# Patient Record
Sex: Female | Born: 1995
Health system: Southern US, Community
[De-identification: ages and names within clinical notes are randomized; demographics above are authoritative.]

## PROBLEM LIST (undated history)

## (undated) HISTORY — PX: APPENDECTOMY: SHX54

---
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
## 2013-07-25 ENCOUNTER — Emergency Department: Payer: Self-pay | Admitting: Emergency Medicine

## 2013-07-25 LAB — URINALYSIS, COMPLETE
BACTERIA: NONE SEEN
BILIRUBIN, UR: NEGATIVE
BLOOD: NEGATIVE
Glucose,UR: NEGATIVE mg/dL (ref 0–75)
KETONE: NEGATIVE
Nitrite: NEGATIVE
Ph: 8 (ref 4.5–8.0)
Protein: NEGATIVE
RBC,UR: NONE SEEN /HPF (ref 0–5)
Specific Gravity: 1.015 (ref 1.003–1.030)
WBC UR: 3 /HPF (ref 0–5)

## 2013-07-25 LAB — COMPREHENSIVE METABOLIC PANEL
AST: 11 U/L (ref 0–26)
Albumin: 3.2 g/dL — ABNORMAL LOW (ref 3.8–5.6)
Alkaline Phosphatase: 53 U/L
Anion Gap: 9 (ref 7–16)
BUN: 6 mg/dL — ABNORMAL LOW (ref 7–18)
Bilirubin,Total: 0.3 mg/dL (ref 0.2–1.0)
CO2: 23 mmol/L (ref 21–32)
CREATININE: 0.45 mg/dL — AB (ref 0.60–1.30)
Calcium, Total: 8.6 mg/dL — ABNORMAL LOW (ref 9.0–10.7)
Chloride: 104 mmol/L (ref 98–107)
EGFR (African American): >60
EGFR (Non-African Amer.): >60
GLUCOSE: 84 mg/dL (ref 65–99)
OSMOLALITY: 269 (ref 275–301)
POTASSIUM: 3.6 mmol/L (ref 3.5–5.1)
SGPT (ALT): 17 U/L (ref 12–78)
SODIUM: 136 mmol/L (ref 136–145)
Total Protein: 7.4 g/dL (ref 6.4–8.6)

## 2013-07-25 LAB — CBC WITH DIFFERENTIAL/PLATELET
Basophil #: 0 10*3/uL (ref 0.0–0.1)
Basophil %: 0.3 %
EOS ABS: 0 10*3/uL (ref 0.0–0.7)
Eosinophil %: 0.4 %
HCT: 39.5 % (ref 35.0–47.0)
HGB: 13.7 g/dL (ref 12.0–16.0)
Lymphocyte #: 1.4 10*3/uL (ref 1.0–3.6)
Lymphocyte %: 13.1 %
MCH: 30.3 pg (ref 26.0–34.0)
MCHC: 34.6 g/dL (ref 32.0–36.0)
MCV: 88 fL (ref 80–100)
MONOS PCT: 4 %
Monocyte #: 0.4 x10 3/mm (ref 0.2–0.9)
NEUTROS PCT: 82.2 %
Neutrophil #: 8.5 10*3/uL — ABNORMAL HIGH (ref 1.4–6.5)
Platelet: 211 10*3/uL (ref 150–440)
RBC: 4.52 10*6/uL (ref 3.80–5.20)
RDW: 13.2 % (ref 11.5–14.5)
WBC: 10.3 10*3/uL (ref 3.6–11.0)

## 2013-10-09 ENCOUNTER — Encounter: Payer: Self-pay | Admitting: Maternal & Fetal Medicine

## 2013-12-11 ENCOUNTER — Encounter: Payer: Self-pay | Admitting: Obstetrics and Gynecology

## 2014-01-28 ENCOUNTER — Inpatient Hospital Stay: Payer: Self-pay | Admitting: Certified Nurse Midwife

## 2014-01-28 LAB — CBC WITH DIFFERENTIAL/PLATELET
BASOS ABS: 0 10*3/uL (ref 0.0–0.1)
BASOS PCT: 0.3 %
EOS ABS: 0 10*3/uL (ref 0.0–0.7)
Eosinophil %: 0.1 %
HCT: 36.7 % (ref 35.0–47.0)
HGB: 12.5 g/dL (ref 12.0–16.0)
LYMPHS PCT: 10.6 %
Lymphocyte #: 1.2 10*3/uL (ref 1.0–3.6)
MCH: 29.4 pg (ref 26.0–34.0)
MCHC: 34 g/dL (ref 32.0–36.0)
MCV: 87 fL (ref 80–100)
MONOS PCT: 5.8 %
Monocyte #: 0.7 x10 3/mm (ref 0.2–0.9)
NEUTROS PCT: 83.2 %
Neutrophil #: 9.6 10*3/uL — ABNORMAL HIGH (ref 1.4–6.5)
Platelet: 197 10*3/uL (ref 150–440)
RBC: 4.24 10*6/uL (ref 3.80–5.20)
RDW: 14.8 % — ABNORMAL HIGH (ref 11.5–14.5)
WBC: 11.5 10*3/uL — AB (ref 3.6–11.0)

## 2014-01-30 LAB — CBC WITH DIFFERENTIAL/PLATELET
Basophil #: 0 10*3/uL (ref 0.0–0.1)
Basophil %: 0.4 %
EOS ABS: 0 10*3/uL (ref 0.0–0.7)
EOS PCT: 0.4 %
HCT: 26.6 % — AB (ref 35.0–47.0)
HGB: 8.9 g/dL — ABNORMAL LOW (ref 12.0–16.0)
LYMPHS PCT: 27.4 %
Lymphocyte #: 2.5 10*3/uL (ref 1.0–3.6)
MCH: 29.9 pg (ref 26.0–34.0)
MCHC: 33.4 g/dL (ref 32.0–36.0)
MCV: 90 fL (ref 80–100)
MONOS PCT: 4.6 %
Monocyte #: 0.4 x10 3/mm (ref 0.2–0.9)
Neutrophil #: 6 10*3/uL (ref 1.4–6.5)
Neutrophil %: 67.2 %
PLATELETS: 139 10*3/uL — AB (ref 150–440)
RBC: 2.97 10*6/uL — ABNORMAL LOW (ref 3.80–5.20)
RDW: 15.5 % — ABNORMAL HIGH (ref 11.5–14.5)
WBC: 9 10*3/uL (ref 3.6–11.0)

## 2014-02-04 LAB — GC/CHLAMYDIA PROBE AMP

## 2016-12-11 ENCOUNTER — Ambulatory Visit (INDEPENDENT_AMBULATORY_CARE_PROVIDER_SITE_OTHER): Payer: 59 | Admitting: Family Medicine

## 2016-12-11 ENCOUNTER — Encounter: Payer: Self-pay | Admitting: Family Medicine

## 2016-12-11 VITALS — BP 120/70 | HR 72 | Ht 64.0 in | Wt 166.0 lb

## 2016-12-11 DIAGNOSIS — Z7689 Persons encountering health services in other specified circumstances: Secondary | ICD-10-CM

## 2016-12-11 NOTE — Progress Notes (Signed)
Name: Bethany Gonzalez   MRN: [id number]    DOB: 06-29-1995   Date:12/11/2016       Progress Note  Subjective  Chief Complaint  Chief Complaint  Patient presents with  . Establish Care    needs PCP    Patient needs to establish care with new physician.    No problem-specific Assessment & Plan notes found for this encounter.   History reviewed. No pertinent past medical history.  Past Surgical History:  Procedure Laterality Date  . APPENDECTOMY      Family History  Problem Relation Age of Onset  . Hypertension Mother   . Hypertension Father   . Diabetes Maternal Grandmother   . Diabetes Paternal Grandmother   . Stroke Paternal Grandmother     Social History   Social History  . Marital status: Single    Spouse name: N/A  . Number of children: N/A  . Years of education: N/A   Occupational History  . Not on file.   Social History Main Topics  . Smoking status: Never Smoker  . Smokeless tobacco: Never Used  . Alcohol use Yes  . Drug use: No  . Sexual activity: No   Other Topics Concern  . Not on file   Social History Narrative  . No narrative on file    No Known Allergies  No outpatient prescriptions prior to visit.   No facility-administered medications prior to visit.     Review of Systems  Constitutional: Negative for chills, fever, malaise/fatigue and weight loss.  HENT: Negative for ear discharge, ear pain and sore throat.   Eyes: Negative for blurred vision.  Respiratory: Negative for cough, sputum production, shortness of breath and wheezing.   Cardiovascular: Negative for chest pain, palpitations and leg swelling.  Gastrointestinal: Negative for abdominal pain, blood in stool, constipation, diarrhea, heartburn, melena and nausea.  Genitourinary: Negative for dysuria, frequency, hematuria and urgency.  Musculoskeletal: Negative for back pain, joint pain, myalgias and neck pain.  Skin: Negative for rash.  Neurological: Positive for headaches.  Negative for dizziness, tingling, sensory change and focal weakness.  Endo/Heme/Allergies: Negative for environmental allergies and polydipsia. Does not bruise/bleed easily.  Psychiatric/Behavioral: Negative for depression and suicidal ideas. The patient is not nervous/anxious and does not have insomnia.      Objective  Vitals:   12/11/16 1505  BP: 120/70  Pulse: 72  Weight: 166 lb (75.3 kg)  Height:  (1.626 m)    Physical Exam  Constitutional: She is oriented to person, place, and time and well-developed, well-nourished, and in no distress.  Cardiovascular: Normal rate and normal heart sounds.   Pulmonary/Chest: Effort normal and breath sounds normal.  Musculoskeletal: Normal range of motion.  Neurological: She is alert and oriented to person, place, and time.  Skin: Skin is warm.  Psychiatric: Affect normal.  Nursing note and vitals reviewed.     Assessment & Plan  Problem List Items Addressed This Visit    None    Visit Diagnoses    Encounter to establish care    -  Primary    Patient is new to the area. Getting established for future issues or concerns  No orders of the defined types were placed in this encounter.     Dr. Hayden [facility]  12/11/16

## 2020-03-16 NOTE — L&D Delivery Note (Signed)
Delivery Note Primary OB: [facility] Delivery Physician: Annamarie Major, MD Gestational Age: Full term Antepartum complications: none Intrapartum complications: None  A viable Female was delivered via vertex presentation.  Apgars:8 ,9  Weight:  pending .   Placenta status: spontaneous and Intact.  Cord: 3+ vessels;  with the following complications: none.  Anesthesia:  none Episiotomy:  none Lacerations:  1st Suture Repair: 2.0 vicryl Est. Blood Loss (mL):  200 mL  Mom to postpartum.  Baby to Couplet care / Skin to Skin.  Annamarie Major, MD, [facility], [facility] 12/07/2020  2:20 AM [phone]

## 2020-05-01 ENCOUNTER — Other Ambulatory Visit (HOSPITAL_COMMUNITY): Payer: Self-pay | Admitting: Advanced Practice Midwife

## 2020-05-01 ENCOUNTER — Ambulatory Visit (INDEPENDENT_AMBULATORY_CARE_PROVIDER_SITE_OTHER): Payer: 59 | Admitting: Advanced Practice Midwife

## 2020-05-01 ENCOUNTER — Other Ambulatory Visit: Payer: Self-pay

## 2020-05-01 ENCOUNTER — Other Ambulatory Visit (HOSPITAL_COMMUNITY)
Admission: RE | Admit: 2020-05-01 | Discharge: 2020-05-01 | Disposition: A | Discharge status: Home / Self Care | Payer: 59 | Source: Ambulatory Visit | Attending: Advanced Practice Midwife | Admitting: Advanced Practice Midwife

## 2020-05-01 ENCOUNTER — Encounter: Payer: Self-pay | Admitting: Advanced Practice Midwife

## 2020-05-01 VITALS — BP 135/77 | Wt 183.0 lb

## 2020-05-01 DIAGNOSIS — Z139 Encounter for screening, unspecified: Secondary | ICD-10-CM

## 2020-05-01 DIAGNOSIS — Z369 Encounter for antenatal screening, unspecified: Secondary | ICD-10-CM | POA: Diagnosis not present

## 2020-05-01 DIAGNOSIS — N926 Irregular menstruation, unspecified: Secondary | ICD-10-CM | POA: Diagnosis not present

## 2020-05-01 DIAGNOSIS — O219 Vomiting of pregnancy, unspecified: Secondary | ICD-10-CM | POA: Diagnosis not present

## 2020-05-01 DIAGNOSIS — Z113 Encounter for screening for infections with a predominantly sexual mode of transmission: Secondary | ICD-10-CM | POA: Insufficient documentation

## 2020-05-01 DIAGNOSIS — Z124 Encounter for screening for malignant neoplasm of cervix: Secondary | ICD-10-CM | POA: Diagnosis not present

## 2020-05-01 DIAGNOSIS — Z3481 Encounter for supervision of other normal pregnancy, first trimester: Secondary | ICD-10-CM | POA: Diagnosis not present

## 2020-05-01 DIAGNOSIS — O99211 Obesity complicating pregnancy, first trimester: Secondary | ICD-10-CM | POA: Diagnosis not present

## 2020-05-01 DIAGNOSIS — Z3490 Encounter for supervision of normal pregnancy, unspecified, unspecified trimester: Secondary | ICD-10-CM | POA: Insufficient documentation

## 2020-05-01 LAB — POCT URINE PREGNANCY: Preg Test, Ur: POSITIVE — AB

## 2020-05-01 MED ORDER — ONDANSETRON 4 MG PO TBDP: 4.0000 mg | ORAL_TABLET | Freq: Four times a day (QID) | ORAL | 2 refills | Status: DC | PRN

## 2020-05-01 NOTE — Progress Notes (Signed)
New Obstetric Patient H&P    Chief Complaint: "Desires prenatal care"   History of Present Illness: Patient is a 25 y.o. G2P1001 Hispanic or Latino female, presents with amenorrhea and positive home pregnancy test. Patient's last menstrual period was 02/24/2020 (exact date). and based on her  LMP, her EDD is Estimated Date of Delivery: 11/30/20 and her EGA is [redacted]w[redacted]d. Cycles are 7 days, irregular, and occur approximately every : 2-3 months. Her last pap smear was 6 or 7 years ago and was no abnormalities.    She had a urine pregnancy test which was positive 1 week(s)  ago. Her last menstrual period was normal and lasted for  7 day(s). Since her LMP she claims she has experienced nausea and vomiting. She denies vaginal bleeding. Her past medical history is noncontributory. Her prior pregnancies are notable for 2016 FT SVD female 7#10oz  Since her LMP, she admits to the use of tobacco products  no She claims she has gained no pounds since the start of her pregnancy.  There are cats in the home in the home  no  She admits close contact with children on a regular basis  yes  She has had chicken pox in the past no She has had Tuberculosis exposures, symptoms, or previously tested positive for TB   no Current or past history of domestic violence. no  Genetic Screening/Teratology Counseling: (Includes patient, baby's father, or anyone in either family with:)   1. Patient's age >/= 37 at [facility]  no 2. Thalassemia ([place], [place], Mediterranean, or Asian background): MCV<80  no 3. Neural tube defect (meningomyelocele, spina bifida, anencephaly)  no 4. Congenital heart defect  no  5. Down syndrome  no 6. Tay-Sachs (Jewish, [place]  no 7. Canavan's Disease  no 8. Sickle cell disease or trait (African)  no  9. Hemophilia or other blood disorders  no  10. Muscular dystrophy  no  11. Cystic fibrosis  no  12. Huntington's Chorea  no  13. Mental retardation/autism  no 14. Other inherited genetic  or chromosomal disorder  no 15. Maternal metabolic disorder (DM, PKU, etc)  no 16. Patient or FOB with a child with a birth defect not listed above no  16a. Patient or FOB with a birth defect themselves no 17. Recurrent pregnancy loss, or stillbirth  no  18. Any medications since LMP other than prenatal vitamins (include vitamins, supplements, OTC meds, drugs, alcohol)  no 19. Any other genetic/environmental exposure to discuss  no  Infection History:   1. Lives with someone with TB or TB exposed  no  2. Patient or partner has history of genital herpes  no 3. Rash or viral illness since LMP  no 4. History of STI (GC, CT, HPV, syphilis, HIV)  no 5. History of recent travel :  no  Other pertinent information:  no    Review of Systems:10 point review of systems negative unless otherwise noted in HPI  Past Medical History:  Patient Active Problem List   Diagnosis Date Noted  . Supervision of normal pregnancy 05/01/2020    Clinic [facility] Prenatal Labs  Dating  Blood type:     Genetic Screen 1 Screen:    AFP:     Quad:     NIPS: Antibody:   Anatomic [place]  Rubella:    Varicella: @VZVIGG @  GTT Early:                Third trimester:  RPR:     Rhogam  HBsAg:     Vaccines TDAP:                       Flu Shot: Covid: HIV:     Baby Food Breast                               GBS:   GC/CT:  Contraception  Pap: 2015 neg, 05/01/20  CBB     CS/VBAC NA   Support Person Francisco         Past Surgical History:  Past Surgical History:  Procedure Laterality Date  . APPENDECTOMY      Gynecologic History: Patient's last menstrual period was 02/24/2020 (exact date).  Obstetric History: G2P1001  Family History:  Family History  Problem Relation Age of Onset  . Hypertension Mother   . Hypertension Father   . Diabetes Maternal Grandmother   . Diabetes Paternal Grandmother   . Stroke Paternal Grandmother     Social History:  Social History   Socioeconomic History  . Marital  status: Single    Spouse name: Not on file  . Number of children: Not on file  . Years of education: Not on file  . Highest education level: Not on file  Occupational History  . Not on file  Tobacco Use  . Smoking status: Never Smoker  . Smokeless tobacco: Never Used  Vaping Use  . Vaping Use: Never used  Substance and Sexual Activity  . Alcohol use: Yes  . Drug use: No  . Sexual activity: Yes  Other Topics Concern  . Not on file  Social History Narrative  . Not on file   Social Determinants of Health   Financial Resource Strain: Not on file  Food Insecurity: Not on file  Transportation Needs: Not on file  Physical Activity: Not on file  Stress: Not on file  Social Connections: Not on file  Intimate Partner Violence: Not on file    Allergies:  No Known Allergies  Medications: Prior to Admission medications   Medication Sig Start Date End Date Taking? Authorizing Provider  ondansetron (ZOFRAN ODT) 4 MG disintegrating tablet Take 1 tablet (4 mg total) by mouth every 6 (six) hours as needed for nausea. 05/01/20   Tresea Mall, CNM  Prenatal 28-0.8 MG TABS Take 1 tablet by mouth daily.    [provider]    Physical Exam Vitals: Blood pressure 135/77, weight 183 lb (83 kg), last menstrual period 02/24/2020.  General: NAD HEENT: normocephalic, anicteric Thyroid: no enlargement, no palpable nodules Pulmonary: No increased work of breathing, CTAB Cardiovascular: RRR, distal pulses 2+ Abdomen: NABS, soft, non-tender, non-distended.  Umbilicus without lesions.  No hepatomegaly, splenomegaly or masses palpable. No evidence of hernia  Genitourinary:  External: Normal external female genitalia.  Normal urethral meatus, normal Bartholin's and Skene's glands.    Vagina: Normal vaginal mucosa, no evidence of prolapse.    Cervix: Grossly normal in appearance, friable on exam  Uterus:  Non-enlarged, mobile, normal contour.  No CMT  Adnexa: deferred  Rectal:  deferred Extremities: no edema, erythema, or tenderness Neurologic: Grossly intact Psychiatric: mood appropriate, affect full   The following were addressed during this visit:  Breastfeeding Education - Early initiation of breastfeeding    Comments: Keeps milk supply adequate, helps contract uterus and slow bleeding, and early milk is the perfect first food and is easy to digest.   - The importance of exclusive  breastfeeding    Comments: Provides antibodies, Lower risk of breast and ovarian cancers, and type-2 diabetes,Helps your body recover, Reduced chance of SIDS.   - Risks of giving your baby anything other than breast milk if you are breastfeeding    Comments: Make the baby less content with breastfeeds, may make my baby more susceptible to illness, and may reduce my milk supply.   - The importance of early skin-to-skin contact    Comments: Keeps baby warm and secure, helps keep baby's blood sugar up and breathing steady, easier to bond and breastfeed, and helps calm baby.  - Rooming-in on a 24-hour basis    Comments: Easier to learn baby's feeding cues, easier to bond and get to know each other, and encourages milk production.   - Feeding on demand or baby-led feeding    Comments: Helps prevent breastfeeding complications, helps bring in good milk supply, prevents under or overfeeding, and helps baby feel content and satisfied   - Frequent feeding to help assure optimal milk production    Comments: Making a full supply of milk requires frequent removal of milk from breasts, infant will eat 8-12 times in 24 hours, if separated from infant use breast massage, hand expression and/ or pumping to remove milk from breasts.   - Effective positioning and attachment    Comments: Helps my baby to get enough breast milk, helps to produce an adequate milk supply, and helps prevent nipple pain and damage   - Exclusive breastfeeding for the first 6 months    Comments: Builds a  healthy milk supply and keeps it up, protects baby from sickness and disease, and breastmilk has everything your baby needs for the first 6 months.  - Individualized Education    Comments: Contraindications to breastfeeding and other special medical conditions Patient has experience breastfeeding her first baby    Assessment: 14 y.o. G2P1001 at [redacted]w[redacted]d presenting to initiate prenatal care  Plan: 1) Avoid alcoholic beverages. 2) Patient encouraged not to smoke.  3) Discontinue the use of all non-medicinal drugs and chemicals.  4) Take prenatal vitamins daily.  5) Nutrition, food safety (fish, cheese advisories, and high nitrite foods) and exercise discussed. 6) Hospital and practice style discussed with cross coverage system.  7) Genetic Screening, such as with 1st Trimester Screening, cell free fetal DNA, AFP testing, and Ultrasound, as well as with amniocentesis and CVS as appropriate, is discussed with patient. At the conclusion of today's visit patient requested cell free DNA genetic testing 8) Patient is asked about travel to areas at risk for the Zika virus, and counseled to avoid travel and exposure to mosquitoes or sexual partners who may have themselves been exposed to the virus. Testing is discussed, and will be ordered as appropriate.  9) PAPtima, urine culture today 10) Return to clinic in 1 week for dating scan, early 1 hr gtt, NOB panel, sickle cell screen, MaterniT 21 if 10+ weeks   Tresea Mall, CNM [facility] 05/01/2020, 3:22 PM

## 2020-05-01 NOTE — Patient Instructions (Signed)
Exercise During Pregnancy Exercise is an important part of being healthy for people of all ages. Exercise improves the function of your heart and lungs and helps you maintain strength, flexibility, and a healthy body weight. Exercise also boosts energy levels and elevates mood. Most women should exercise regularly during pregnancy. Exercise routines may need to change as your pregnancy progresses. In rare cases, women with certain medical conditions or complications may be asked to limit or avoid exercise during pregnancy. Your health care provider will give you information on what will work for you. How does this affect me? Along with maintaining general strength and flexibility, exercising during pregnancy can help:  Keep strength in muscles that are used during labor and childbirth.  Decrease low back pain or symptoms of depression.  Control weight gain during pregnancy.  Reduce the risk of needing insulin if you develop diabetes during pregnancy.  Decrease the risk of cesarean delivery.  Speed up your recovery after giving birth.  Relieve constipation. How does this affect my baby? Exercise can help you have a healthy pregnancy. Exercise does not cause early (premature) birth. It will not cause your baby to weigh less at birth. What exercises can I do? Many exercises are safe for you to do during pregnancy. Do a variety of exercises that safely increase your heart and breathing rates and help you build and maintain muscle strength. Do exercises exactly as told by your health care provider. You may do these exercises:  Walking.  Swimming.  Water aerobics.  Riding a stationary bike.  Modified yoga or Pilates. Tell your instructor that you are pregnant. Avoid overstretching, and avoid lying on your back for long periods of time.  Running or jogging. Choose this type of exercise only if: ? You ran or jogged regularly before your pregnancy. ? You can run or jog and still talk in  complete sentences.   What exercises should I avoid? You may be told to limit high-intensity exercise depending on your level of fitness and whether you exercised regularly before you were pregnant. You can tell that you are exercising at a high intensity if you are breathing much harder and faster and cannot hold a conversation while exercising. You must avoid:  Contact sports.  Activities that put you at risk for falling on or being hit in the belly, such as downhill skiing, waterskiing, surfing, rock climbing, cycling, gymnastics, and horseback riding.  Scuba diving.  Skydiving.  Hot yoga or hot Pilates. These activities take place in a room that is heated to high temperatures.  Jogging or running, unless you jogged or ran regularly before you were pregnant. While jogging or running, you should always be able to talk in full sentences. Do not run or jog so fast that you are unable to have a conversation.  Do not exercise at more than 6,000 feet above sea level (high elevation) if you are not used to exercising at high elevation. How do I exercise in a safe way?  Avoid overheating. Do not exercise in very high temperatures.  Wear loose-fitting, breathable clothes.  Avoid dehydration. Drink enough fluid before, during, and after exercise to keep your urine pale yellow.  Avoid overstretching. Because of hormone changes during pregnancy, it is easy to overstretch muscles, tendons, and ligaments.  Start slowly and ask your health care provider to recommend the types of exercise that are safe for you.  Do not exercise to lose weight.  Wear a sports bra to support your breasts.  Avoid standing still or lying flat on your back as much as you can.   Follow these instructions at home:  Exercise on most days or all days of the week. Try to exercise for 30 minutes a day, 5 days a week, unless your health care provider tells you not to.  If you actively exercised before your pregnancy and  you are healthy, your health care provider may tell you to continue to do moderate-intensity to high-intensity exercise.  If you are just starting to exercise or did not exercise much before your pregnancy, your health care provider may tell you to do low-intensity to moderate-intensity exercise. Questions to ask your health care provider  Is exercise safe for me?  What are signs that I should stop exercising?  Does my health condition mean that I should not exercise during pregnancy?  When should I avoid exercising during pregnancy? Stop exercising and contact a health care provider if: You have any unusual symptoms such as:  Mild contractions of the uterus or cramps in the abdomen.  A dizzy feeling that does not go away when you rest. Stop exercising and get help right away if: You have any unusual symptoms such as:  Sudden, severe pain in your low back or your belly.  Regular, painful contractions of your uterus.  Chest pain.  Bleeding or fluid leaking from your vagina.  Shortness of breath.  Headache.  Pain and swelling of your calves. Summary  Most women should exercise regularly throughout pregnancy. In rare cases, women with certain medical conditions or complications may be asked to limit or avoid exercise during pregnancy.  Do not exercise to lose weight during pregnancy.  Your health care provider will tell you what level of physical activity is right for you.  Stop exercising and contact a health care provider if you have unusual symptoms, such as mild contractions or dizziness. This information is not intended to replace advice given to you by your health care provider. Make sure you discuss any questions you have with your health care provider. Document Revised: 10/18/2019 Document Reviewed: 10/18/2019 Elsevier Patient Education  2021 [place] for Pregnant Women While you are pregnant, your body requires additional nutrition to help  support your growing baby. You also have a higher need for some vitamins and minerals, such as folic acid, calcium, iron, and vitamin D. Eating a healthy, well-balanced diet is very important for your health and your baby's health. Your need for extra calories varies over the course of your pregnancy. Pregnancy is divided into three trimesters, with each trimester lasting 3 months. For most women, it is recommended to consume:  150 extra calories a day during the first trimester.  300 extra calories a day during the second trimester.  300 extra calories a day during the third trimester. What are tips for following this plan? Cooking  Practice good food safety and cleanliness. Wash your hands before you eat and after you prepare raw meat. Wash all fruits and vegetables well before peeling or eating. Taking these actions can help to prevent foodborne illnesses that can be very dangerous to your baby, such as listeriosis. Ask your health care provider for more information about listeriosis.  Make sure that all meats, poultry, and eggs are cooked to food-safe temperatures or "well-done." Meal planning  Eat a variety of foods (especially fruits and vegetables) to get a full range of vitamins and minerals.  Two or more servings of fish are recommended each week in  order to get the most benefits from omega-3 fatty acids that are found in seafood. Choose fish that are lower in mercury, such as salmon and pollock.  Limit your overall intake of foods that have "empty calories." These are foods that have little nutritional value, such as sweets, desserts, candies, and sugar-sweetened beverages.  Drinks that contain caffeine are okay to drink, but it is better to avoid caffeine. Keep your total caffeine intake to less than 200 mg each day (which is 12 oz or 355 mL of coffee, tea, or soda) or the limit as told by your health care provider.   General information  Do not try to lose weight or go on a diet  during pregnancy.  Take a prenatal vitamin to help meet your additional vitamin and mineral needs during pregnancy, specifically for folic acid, iron, calcium, and vitamin D.  Remember to stay active. Ask your health care provider what types of exercise and activities are safe for you. What does 150 extra calories look like? Healthy options that provide 150 extra calories each day could be any of the following:  6-8 oz (170-227 g) plain low-fat yogurt with  cup (70 g) berries.  1 apple with 2 tsp (11 g) peanut butter.  Cut-up vegetables with  cup (60 g) hummus.  8 fl oz (237 mL) low-fat chocolate milk.  1 stick of string cheese with 1 medium orange.  1 peanut butter and jelly sandwich that is made with one slice of whole-wheat bread and 1 tsp (5 g) of peanut butter. For 300 extra calories, you could eat two of these healthy options each day. What is a healthy amount of weight to gain? The right amount of weight gain for you is based on your BMI (body mass index) before you became pregnant.  If your BMI was less than 18 (underweight), you should gain 28-40 lb (13-18 kg).  If your BMI was 18-24.9 (normal), you should gain 25-35 lb (11-16 kg).  If your BMI was 25-29.9 (overweight), you should gain 15-25 lb (7-11 kg).  If your BMI was 30 or greater (obese), you should gain 11-20 lb (5-9 kg). What if I am having twins or multiples? Generally, if you are carrying twins or multiples:  You may need to eat 300-600 extra calories a day.  The recommended range for total weight gain is 25-54 lb (11-25 kg), depending on your BMI before pregnancy.  Talk with your health care provider to find out about nutritional needs, weight gain, and exercise that is right for you. What foods should I eat? Fruits All fruits. Eat a variety of colors and types of fruit. Remember to wash your fruits well before peeling or eating. Vegetables All vegetables. Eat a variety of colors and types of  vegetables. Remember to wash your vegetables well before peeling or eating. Grains All grains. Choose whole grains, such as whole-wheat bread, oatmeal, or brown rice. Meats and other protein foods Lean meats, including chicken, turkey, and lean cuts of beef, veal, or pork. Fish that is higher in omega-3 fatty acids and lower in mercury, such as salmon, herring, mussels, trout, sardines, pollock, shrimp, crab, and lobster. Tofu. Tempeh. Beans. Eggs. Peanut butter and other nut butters. Dairy Pasteurized milk and milk alternatives, such as almond milk. Pasteurized yogurt and pasteurized cheese. Cottage cheese. Sour cream. Beverages Water. Juices that contain 100% fruit juice or vegetable juice. Caffeine-free teas and decaffeinated coffee. Fats and oils Fats and oils are okay to include in moderation.   Sweets and desserts Sweets and desserts are okay to include in moderation. Seasoning and other foods All pasteurized condiments. The items listed above may not be a complete list of foods and beverages you can eat. Contact a dietitian for more information.   What foods should I avoid? Fruits Raw (unpasteurized) fruit juices. Vegetables Unpasteurized vegetable juices. Meats and other protein foods Precooked or cured meat, such as bologna, hot dogs, sausages, or meat loaves. (If you must eat those meats, reheat them until they are steaming hot.) Refrigerated pate, meat spreads from a meat counter, or smoked seafood that is found in the refrigerated section of a store. Raw or undercooked meats, poultry, and eggs. Raw fish, such as sushi or sashimi. Fish that have high mercury content, such as tilefish, shark, swordfish, and king mackerel. Dairy Unpasteurized milk and any foods that have unpasteurized milk in them. Soft cheeses, such as feta, queso blanco, queso fresco, [place], [place], panela, and blue-veined cheeses (unless they are made with pasteurized milk, which must be stated on the  label). Beverages Alcohol. Sugar-sweetened beverages, such as sodas, teas, or energy drinks. Seasoning and other foods Homemade fermented foods and drinks, such as pickles, sauerkraut, or kombucha drinks. (Store-bought pasteurized versions of these are okay.) Salads that are made in a store or deli, such as ham salad, chicken salad, egg salad, tuna salad, and seafood salad. The items listed above may not be a complete list of foods and beverages you should avoid. Contact a dietitian for more information. Where to find more information To calculate the number of calories you need based on your height, weight, and activity level, you can use an online calculator such as:  PayStrike.dk To calculate how much weight you should gain during pregnancy, you can use an online pregnancy weight gain calculator such as:  http://www.harvey.com/ To learn more about eating fish during pregnancy, talk with your health care provider or visit:  PumpkinSearch.com.ee Summary  While you are pregnant, your body requires additional nutrition to help support your growing baby.  Eat a variety of foods, especially fruits and vegetables, to get a full range of vitamins and minerals.  Practice good food safety and cleanliness. Wash your hands before you eat and after you prepare raw meat. Wash all fruits and vegetables well before peeling or eating. Taking these actions can help to prevent foodborne illnesses, such as listeriosis, that can be very dangerous to your baby.  Do not eat raw meat or fish. Do not eat fish that have high mercury content, such as tilefish, shark, swordfish, and king mackerel. Do not eat raw (unpasteurized) dairy.  Take a prenatal vitamin to help meet your additional vitamin and mineral needs during pregnancy, specifically for folic acid, iron, calcium, and vitamin D. This information is not intended to replace advice given to you by your health care provider. Make sure you discuss any  questions you have with your health care provider. Document Revised: 09/28/2019 Document Reviewed: 09/28/2019 Elsevier Patient Education  2021 Elsevier Inc. Morning Sickness  Morning sickness is when a woman feels nauseous during pregnancy. This nauseous feeling may or may not come with vomiting. It often occurs in the morning, but it can be a problem at any time of day. Morning sickness is most common during the first trimester. In some cases, it may continue throughout pregnancy. Although morning sickness is unpleasant, it is usually harmless unless the woman develops severe and continual vomiting (hyperemesis gravidarum), a condition that requires more intense treatment. What are  the causes? The exact cause of this condition is not known, but it seems to be related to normal hormonal changes that occur in pregnancy. What increases the risk? You are more likely to develop this condition if:  You experienced nausea or vomiting before your pregnancy.  You had morning sickness during a previous pregnancy.  You are pregnant with more than one baby, such as twins. What are the signs or symptoms? Symptoms of this condition include:  Nausea.  Vomiting. How is this diagnosed? This condition is usually diagnosed based on your signs and symptoms. How is this treated? In many cases, treatment is not needed for this condition. Making some changes to what you eat may help to control symptoms. Your health care provider may also prescribe or recommend:  Vitamin B6 supplements.  Anti-nausea medicines.  Ginger. Follow these instructions at home: Medicines  Take over-the-counter and prescription medicines only as told by your health care provider. Do not use any prescription, over-the-counter, or herbal medicines for morning sickness without first talking with your health care provider.  Take multivitamins before getting pregnant. This can prevent or decrease the severity of morning sickness in  most women. Eating and drinking  Eat a piece of dry toast or crackers before getting out of bed in the morning.  Eat 5 or 6 small meals a day.  Eat dry and bland foods, such as rice or a baked potato. Foods that are high in carbohydrates are often helpful.  Avoid greasy, fatty, and spicy foods.  Have someone cook for you if the smell of any food causes nausea and vomiting.  If you feel nauseous after taking prenatal vitamins, take the vitamins at night or with a snack.  Eat a protein snack between meals if you are hungry. Nuts, yogurt, and cheese are good options.  Drink fluids throughout the day.  Try ginger ale made with real ginger, ginger tea made from fresh grated ginger, or ginger candies. General instructions  Do not use any products that contain nicotine or tobacco. These products include cigarettes, chewing tobacco, and vaping devices, such as e-cigarettes. If you need help quitting, ask your health care provider.  Get an air purifier to keep the air in your house free of odors.  Get plenty of fresh air.  Try to avoid odors that trigger your nausea.  Consider trying these methods to help relieve symptoms: ? Wearing an acupressure wristband. These wristbands are often worn for seasickness. ? Acupuncture. Contact a health care provider if:  Your home remedies are not working and you need medicine.  You feel dizzy or light-headed.  You are losing weight. Get help right away if:  You have persistent and uncontrolled nausea and vomiting.  You faint.  You have severe pain in your abdomen. Summary  Morning sickness is when a woman feels nauseous during pregnancy. This nauseous feeling may or may not come with vomiting.  Morning sickness is most common during the first trimester.  It often occurs in the morning, but it can be a problem at any time of day.  In many cases, treatment is not needed for this condition. Making some changes to what you eat may help to  control symptoms. This information is not intended to replace advice given to you by your health care provider. Make sure you discuss any questions you have with your health care provider. Document Revised: 10/16/2019 Document Reviewed: 09/25/2019 Elsevier Patient Education  2021 Elsevier Inc. Obstetrics: Normal and Problem Pregnancies (7th ed.,  pp. 102-121). [place], [place]: Elsevier."> Textbook of Family Medicine (9th ed., pp. [phone]). [place], [place]: [employer] [place].">  First Trimester of Pregnancy  The first trimester of pregnancy starts on the first day of your last menstrual period until the end of week 12. This is months 1 through 3 of pregnancy. A week after a sperm fertilizes an egg, the egg will implant into the wall of the uterus and begin to develop into a baby. By the end of 12 weeks, all the baby's organs will be formed and the baby will be 2-3 inches in size. Body changes during your first trimester Your body goes through many changes during pregnancy. The changes vary and generally return to normal after your baby is born. Physical changes  You may gain or lose weight.  Your breasts may begin to grow larger and become tender. The tissue that surrounds your nipples (areola) may become darker.  Dark spots or blotches (chloasma or mask of pregnancy) may develop on your face.  You may have changes in your hair. These can include thickening or thinning of your hair or changes in texture. Health changes  You may feel nauseous, and you may vomit.  You may have heartburn.  You may develop headaches.  You may develop constipation.  Your gums may bleed and may be sensitive to brushing and flossing. Other changes  You may tire easily.  You may urinate more often.  Your menstrual periods will stop.  You may have a loss of appetite.  You may develop cravings for certain kinds of food.  You may have changes in your emotions from day to day.  You may have more  vivid and strange dreams. Follow these instructions at home: Medicines  Follow your health care provider's instructions regarding medicine use. Specific medicines may be either safe or unsafe to take during pregnancy. Do not take any medicines unless told to by your health care provider.  Take a prenatal vitamin that contains at least 600 micrograms (mcg) of folic acid. Eating and drinking  Eat a healthy diet that includes fresh fruits and vegetables, whole grains, good sources of protein such as meat, eggs, or tofu, and low-fat dairy products.  Avoid raw meat and unpasteurized juice, milk, and cheese. These carry germs that can harm you and your baby.  If you feel nauseous or you vomit: ? Eat 4 or 5 small meals a day instead of 3 large meals. ? Try eating a few soda crackers. ? Drink liquids between meals instead of during meals.  You may need to take these actions to prevent or treat constipation: ? Drink enough fluid to keep your urine pale yellow. ? Eat foods that are high in fiber, such as beans, whole grains, and fresh fruits and vegetables. ? Limit foods that are high in fat and processed sugars, such as fried or sweet foods. Activity  Exercise only as directed by your health care provider. Most people can continue their usual exercise routine during pregnancy. Try to exercise for 30 minutes at least 5 days a week.  Stop exercising if you develop pain or cramping in the lower abdomen or lower back.  Avoid exercising if it is very hot or humid or if you are at high altitude.  Avoid heavy lifting.  If you choose to, you may have sex unless your health care provider tells you not to. Relieving pain and discomfort  Wear a good support bra to relieve breast tenderness.  Rest with your legs elevated  if you have leg cramps or low back pain.  If you develop bulging veins (varicose veins) in your legs: ? Wear support hose as told by your health care provider. ? Elevate your  feet for 15 minutes, 3-4 times a day. ? Limit salt in your diet. Safety  Wear your seat belt at all times when driving or riding in a car.  Talk with your health care provider if someone is verbally or physically abusive to you.  Talk with your health care provider if you are feeling sad or have thoughts of hurting yourself. Lifestyle  Do not use hot tubs, steam rooms, or saunas.  Do not douche. Do not use tampons or scented sanitary pads.  Do not use herbal remedies, alcohol, illegal drugs, or medicines that are not approved by your health care provider. Chemicals in these products can harm your baby.  Do not use any products that contain nicotine or tobacco, such as cigarettes, e-cigarettes, and chewing tobacco. If you need help quitting, ask your health care provider.  Avoid cat litter boxes and soil used by cats. These carry germs that can cause birth defects in the baby and possibly loss of the unborn baby (fetus) by miscarriage or stillbirth. General instructions  During routine prenatal visits in the first trimester, your health care provider will do a physical exam, perform necessary tests, and ask you how things are going. Keep all follow-up visits. This is important.  Ask for help if you have counseling or nutritional needs during pregnancy. Your health care provider can offer advice or refer you to specialists for help with various needs.  Schedule a dentist appointment. At home, brush your teeth with a soft toothbrush. Floss gently.  Write down your questions. Take them to your prenatal visits. Where to find more information  American Pregnancy Association: americanpregnancy.org  [employer]: https://www.todd-brady.net/  Office on [employer] Health: MightyReward.co.nz Contact a health care provider if you have:  Dizziness.  A fever.  Mild pelvic cramps, pelvic pressure, or nagging pain in the abdominal  area.  Nausea, vomiting, or diarrhea that lasts for 24 hours or longer.  A bad-smelling vaginal discharge.  Pain when you urinate.  Known exposure to a contagious illness, such as chickenpox, measles, Zika virus, HIV, or hepatitis. Get help right away if you have:  Spotting or bleeding from your vagina.  Severe abdominal cramping or pain.  Shortness of breath or chest pain.  Any kind of trauma, such as from a fall or a car crash.  New or increased pain, swelling, or redness in an arm or leg. Summary  The first trimester of pregnancy starts on the first day of your last menstrual period until the end of week 12 (months 1 through 3).  Eating 4 or 5 small meals a day rather than 3 large meals may help to relieve nausea and vomiting.  Do not use any products that contain nicotine or tobacco, such as cigarettes, e-cigarettes, and chewing tobacco. If you need help quitting, ask your health care provider.  Keep all follow-up visits. This is important. This information is not intended to replace advice given to you by your health care provider. Make sure you discuss any questions you have with your health care provider. Document Revised: 08/09/2019 Document Reviewed: 06/15/2019 Elsevier Patient Education  2021 Elsevier Inc. https://www.acog.org/womens-health/faqs/prenatal-genetic-screening-tests">  Prenatal Care Prenatal care is health care during pregnancy. It helps you and your unborn baby (fetus) stay as healthy as possible. Prenatal care  may be provided by a midwife, a family practice doctor, a Dispensing optician (nurse practitioner or physician assistant), or a childbirth and pregnancy doctor (obstetrician). How does this affect me? During pregnancy, you will be closely monitored for any new conditions that might develop. To lower your risk of pregnancy complications, you and your health care provider will talk about any underlying conditions you have. How does this affect my  baby? Early and consistent prenatal care increases the chance that your baby will be healthy during pregnancy. Prenatal care lowers the risk that your baby will be:  Born early (prematurely).  Smaller than expected at birth (small for gestational age). What can I expect at the first prenatal care visit? Your first prenatal care visit will likely be the longest. You should schedule your first prenatal care visit as soon as you know that you are pregnant. Your first visit is a good time to talk about any questions or concerns you have about pregnancy. Medical history At your visit, you and your health care provider will talk about your medical history, including:  Any past pregnancies.  Your family's medical history.  Medical history of the baby's father.  Any long-term (chronic) health conditions you have and how you manage them.  Any surgeries or procedures you have had.  Any current over-the-counter or prescription medicines, herbs, or supplements that you are taking.  Other factors that could pose a risk to your baby, including: ? Exposure to harmful chemicals or radiation at work or at home. ? Any substance use, including tobacco, alcohol, and drug use.  Your home setting and your stress levels, including: ? Exposure to abuse or violence. ? Household financial strain.  Your daily health habits, including diet and exercise. Tests and screenings Your health care provider will:  Measure your weight, height, and blood pressure.  Do a physical exam, including a pelvic and breast exam.  Perform blood tests and urine tests to check for: ? Urinary tract infection. ? Sexually transmitted infections (STIs). ? Low iron levels in your blood (anemia). ? Blood type and certain proteins on red blood cells (Rh antibodies). ? Infections and immunity to viruses, such as hepatitis B and rubella. ? HIV (human immunodeficiency virus).  Discuss your options for genetic screening. Tips  about staying healthy Your health care provider will also give you information about how to keep yourself and your baby healthy, including:  Nutrition and taking vitamins.  Physical activity.  How to manage pregnancy symptoms such as nausea and vomiting (morning sickness).  Infections and substances that may be harmful to your baby and how to avoid them.  Food safety.  Dental care.  Working.  Travel.  Warning signs to watch for and when to call your health care provider. How often will I have prenatal care visits? After your first prenatal care visit, you will have regular visits throughout your pregnancy. The visit schedule is often as follows:  Up to week 28 of pregnancy: once every 4 weeks.  28-36 weeks: once every 2 weeks.  After 36 weeks: every week until delivery. Some women may have visits more or less often depending on any underlying health conditions and the health of the baby. Keep all follow-up and prenatal care visits. This is important. What happens during routine prenatal care visits? Your health care provider will:  Measure your weight and blood pressure.  Check for fetal heart sounds.  Measure the height of your uterus in your abdomen (fundal height). This may be measured  starting around week 20 of pregnancy.  Check the position of your baby inside your uterus.  Ask questions about your diet, sleeping patterns, and whether you can feel the baby move.  Review warning signs to watch for and signs of labor.  Ask about any pregnancy symptoms you are having and how you are dealing with them. Symptoms may include: ? Headaches. ? Nausea and vomiting. ? Vaginal discharge. ? Swelling. ? Fatigue. ? Constipation. ? Changes in your vision. ? Feeling persistently sad or anxious. ? Any discomfort, including back or pelvic pain. ? Bleeding or spotting. Make a list of questions to ask your health care provider at your routine visits.   What tests might I have  during prenatal care visits? You may have blood, urine, and imaging tests throughout your pregnancy, such as:  Urine tests to check for glucose, protein, or signs of infection.  Glucose tests to check for a form of diabetes that can develop during pregnancy (gestational diabetes mellitus). This is usually done around week 24 of pregnancy.  Ultrasounds to check your baby's growth and development, to check for birth defects, and to check your baby's well-being. These can also help to decide when you should deliver your baby.  A test to check for group B strep (GBS) infection. This is usually done around week 36 of pregnancy.  Genetic testing. This may include blood, fluid, or tissue sampling, or imaging tests, such as an ultrasound. Some genetic tests are done during the first trimester and some are done during the second trimester. What else can I expect during prenatal care visits? Your health care provider may recommend getting certain vaccines during pregnancy. These may include:  A yearly flu shot (annual influenza vaccine). This is especially important if you will be pregnant during flu season.  Tdap (tetanus, diphtheria, pertussis) vaccine. Getting this vaccine during pregnancy can protect your baby from whooping cough (pertussis) after birth. This vaccine may be recommended between weeks 27 and 36 of pregnancy.  A COVID-19 vaccine. Later in your pregnancy, your health care provider may give you information about:  Childbirth and breastfeeding classes.  Choosing a health care provider for your baby.  Umbilical cord banking.  Breastfeeding.  Birth control after your baby is born.  The hospital labor and delivery unit and how to set up a tour.  Registering at the hospital before you go into labor. Where to find more information  Office on Women's Health: TravelLesson.cawomenshealth.gov  American Pregnancy Association: americanpregnancy.org  March of Dimes:  marchofdimes.org Summary  Prenatal care helps you and your baby stay as healthy as possible during pregnancy.  Your first prenatal care visit will most likely be the longest.  You will have visits and tests throughout your pregnancy to monitor your health and your baby's health.  Bring a list of questions to your visits to ask your health care provider.  Make sure to keep all follow-up and prenatal care visits. This information is not intended to replace advice given to you by your health care provider. Make sure you discuss any questions you have with your health care provider. Document Revised: 12/14/2019 Document Reviewed: 12/14/2019 Elsevier Patient Education  2021 [employer]

## 2020-05-03 LAB — URINE CULTURE

## 2020-05-07 ENCOUNTER — Ambulatory Visit (INDEPENDENT_AMBULATORY_CARE_PROVIDER_SITE_OTHER): Payer: 59

## 2020-05-07 ENCOUNTER — Ambulatory Visit (INDEPENDENT_AMBULATORY_CARE_PROVIDER_SITE_OTHER): Payer: 59 | Admitting: Obstetrics and Gynecology

## 2020-05-07 ENCOUNTER — Other Ambulatory Visit: Payer: 59

## 2020-05-07 ENCOUNTER — Other Ambulatory Visit: Payer: Self-pay | Admitting: Obstetrics and Gynecology

## 2020-05-07 ENCOUNTER — Other Ambulatory Visit: Payer: Self-pay

## 2020-05-07 VITALS — BP 110/72 | Wt 184.0 lb

## 2020-05-07 DIAGNOSIS — O99211 Obesity complicating pregnancy, first trimester: Secondary | ICD-10-CM | POA: Diagnosis not present

## 2020-05-07 DIAGNOSIS — Z113 Encounter for screening for infections with a predominantly sexual mode of transmission: Secondary | ICD-10-CM | POA: Diagnosis not present

## 2020-05-07 DIAGNOSIS — Z3481 Encounter for supervision of other normal pregnancy, first trimester: Secondary | ICD-10-CM

## 2020-05-07 DIAGNOSIS — Z139 Encounter for screening, unspecified: Secondary | ICD-10-CM | POA: Diagnosis not present

## 2020-05-07 DIAGNOSIS — Z3A10 10 weeks gestation of pregnancy: Secondary | ICD-10-CM

## 2020-05-07 DIAGNOSIS — Z369 Encounter for antenatal screening, unspecified: Secondary | ICD-10-CM

## 2020-05-07 LAB — POCT URINALYSIS DIPSTICK OB
Glucose, UA: NEGATIVE
POC,PROTEIN,UA: NEGATIVE

## 2020-05-07 LAB — CYTOLOGY - PAP
Chlamydia: POSITIVE — AB
Comment: NEGATIVE
Comment: NEGATIVE
Comment: NORMAL
Diagnosis: NEGATIVE
Neisseria Gonorrhea: NEGATIVE
Trichomonas: NEGATIVE

## 2020-05-07 NOTE — Progress Notes (Signed)
° ° °  Routine Prenatal Care Visit  Subjective  Bethany Gonzalez is a 25 y.o. G2P1001 at [redacted]w[redacted]d being seen today for ongoing prenatal care.  She is currently monitored for the following issues for this low-risk pregnancy and has Supervision of normal pregnancy on their problem list.  ----------------------------------------------------------------------------------- Patient reports pelvic pressure. Denies VB or cramping..    Lockie Pares. Bleeding: None.   . Denies leaking of fluid.  ----------------------------------------------------------------------------------- The following portions of the patient's history were reviewed and updated as appropriate: allergies, current medications, past family history, past medical history, past social history, past surgical history and problem list. Problem list updated.   Objective  Blood pressure 110/72, weight 184 lb (83.5 kg), last menstrual period 02/24/2020. Pregravid weight 183 lb (83 kg) Total Weight Gain 1 lb (0.454 kg) Urinalysis:      Fetal Status: Fetal Heart Rate (bpm): 168 ([place])         General:  Alert, oriented and cooperative. Patient is in no acute distress.  Skin: Skin is warm and dry. No rash noted.   Cardiovascular: Normal heart rate noted  Respiratory: Normal respiratory effort, no problems with respiration noted  Abdomen: Soft, gravid, appropriate for gestational age. Pain/Pressure: Present     Pelvic:  Cervical exam deferred        Extremities: Normal range of motion.     ental Status: Normal mood and affect. Normal behavior. Normal judgment and thought content.     Assessment   25 y.o. G2P1001 at [redacted]w[redacted]d by  12/13/2020, by Ultrasound presenting for routine prenatal visit  Plan   pregnancy2  Problems (from 02/24/20 to present)    Problem Noted Resolved   Supervision of normal pregnancy 05/01/2020 by Tresea Mall, CNM No   Overview Addendum 05/07/2020 11:23 AM by Zipporah Plants, CNM     Nursing Staff Provider  Office Location   [facility] Dating   8w [place]  Language  English Anatomy US    Flu Vaccine   UTD Genetic Screen  declines  TDaP vaccine    Hgb A1C or  GTT Early : Third trimester :   Rhogam     LAB RESULTS   Feeding Plan  Blood Type     Contraception  Antibody    Circumcision  Rubella    Pediatrician   RPR     Support Person  [facility]Bs[facility]     Prenatal Classes  HIV      Varicella @[email or web address] @   BTL Consent  n/a GBS  (For PCN allergy, check sensitivities)        VBAC Consent  n/a Pap  05/01/20    Hgb Electro      CF      SMA                Previous Version      -Dating 05/03/20 reviewed - EDD not consistent with LMP - dates changed and discussed with patient -Patient declines NIPTs testing -[facility] labs today  First trimester precautions including but not limited to vaginal bleeding, contractions, leaking of fluid and fetal movement were reviewed in detail with the patient.    Return in about 4 weeks (around 06/04/2020) for ROB.  06/06/2020, CNM, MSN [facility], [facility] 05/07/2020, 11:23 AM

## 2020-05-09 ENCOUNTER — Other Ambulatory Visit: Payer: Self-pay | Admitting: Advanced Practice Midwife

## 2020-05-09 DIAGNOSIS — A749 Chlamydial infection, unspecified: Secondary | ICD-10-CM

## 2020-05-09 LAB — HGB FRACTIONATION CASCADE
Hgb A2: 3 % (ref 1.8–3.2)
Hgb A: 97 % (ref 96.4–98.8)
Hgb F: 0 % (ref 0.0–2.0)
Hgb S: 0 %

## 2020-05-09 LAB — RPR+RH+ABO+RUB AB+AB SCR+CB...
Antibody Screen: NEGATIVE
HIV Screen 4th Generation wRfx: NONREACTIVE
Hematocrit: 39.7 % (ref 34.0–46.6)
Hemoglobin: 13.5 g/dL (ref 11.1–15.9)
Hepatitis B Surface Ag: NEGATIVE
MCH: 29.9 pg (ref 26.6–33.0)
MCHC: 34 g/dL (ref 31.5–35.7)
MCV: 88 fL (ref 79–97)
Platelets: 218 10*3/uL (ref 150–450)
RBC: 4.51 x10E6/uL (ref 3.77–5.28)
RDW: 12.7 % (ref 11.7–15.4)
RPR Ser Ql: NONREACTIVE
Rh Factor: POSITIVE
Rubella Antibodies, IGG: 1.82 index (ref >0.99)
Varicella zoster IgG: 1168 index (ref >165)
WBC: 8.1 10*3/uL (ref 3.4–10.8)

## 2020-05-09 LAB — GLUCOSE, 1 HOUR GESTATIONAL: Gestational Diabetes Screen: 96 mg/dL (ref 65–139)

## 2020-05-09 MED ORDER — AZITHROMYCIN 500 MG PO TABS: 1000.0000 mg | ORAL_TABLET | Freq: Once | ORAL | 0 refills | Status: AC

## 2020-05-09 NOTE — Progress Notes (Signed)
Rx azithromycin sent to treat chlamydia infection. Left voicemail and sent message regarding diagnosis and Rx. Patient called back right away and she is aware of need for treatment. Communicable dx reporting done.

## 2020-05-13 ENCOUNTER — Other Ambulatory Visit: Payer: Self-pay | Admitting: Obstetrics & Gynecology

## 2020-05-13 MED ORDER — AZITHROMYCIN 500 MG PO TABS: 1000.0000 mg | ORAL_TABLET | Freq: Once | ORAL | 0 refills | Status: AC

## 2020-05-13 NOTE — Telephone Encounter (Signed)
Can you send in a Rx for pt she tested positive for chlamydia.

## 2020-06-04 ENCOUNTER — Encounter: Payer: Self-pay | Admitting: Advanced Practice Midwife

## 2020-06-04 ENCOUNTER — Ambulatory Visit (INDEPENDENT_AMBULATORY_CARE_PROVIDER_SITE_OTHER): Payer: 59 | Admitting: Advanced Practice Midwife

## 2020-06-04 ENCOUNTER — Other Ambulatory Visit: Payer: Self-pay

## 2020-06-04 VITALS — BP 118/78 | Wt 179.0 lb

## 2020-06-04 DIAGNOSIS — Z3481 Encounter for supervision of other normal pregnancy, first trimester: Secondary | ICD-10-CM

## 2020-06-04 DIAGNOSIS — O219 Vomiting of pregnancy, unspecified: Secondary | ICD-10-CM

## 2020-06-04 DIAGNOSIS — Z3A12 12 weeks gestation of pregnancy: Secondary | ICD-10-CM

## 2020-06-04 MED ORDER — PROMETHAZINE HCL 12.5 MG PO TABS
12.5000 mg | ORAL_TABLET | Freq: Four times a day (QID) | ORAL | 1 refills | Status: DC | PRN
Start: 2020-06-04 — End: 2020-08-27

## 2020-06-04 NOTE — Progress Notes (Signed)
  Routine Prenatal Care Visit  Subjective  Bethany Gonzalez is a 25 y.o. G2P1001 at [redacted]w[redacted]d being seen today for ongoing prenatal care.  She is currently monitored for the following issues for this low-risk pregnancy and has Supervision of normal pregnancy on their problem list.  ----------------------------------------------------------------------------------- Patient reports nausea and vomiting ongoing. She is able to keep some things down including fluids and has had a 24 hour period when she couldn't keep anything down. She would like to try PO phenergan prior to trying suppositoy phenergan if needed. She knows to call for worsening. Encouraged PO fluids and small frequent amounts of food, sea bands.    . Vag. Bleeding: None.   . Leaking Fluid denies.  ----------------------------------------------------------------------------------- The following portions of the patient's history were reviewed and updated as appropriate: allergies, current medications, past family history, past medical history, past social history, past surgical history and problem list. Problem list updated.  Objective  Blood pressure 118/78, weight 179 lb (81.2 kg), last menstrual period 02/24/2020. Pregravid weight 183 lb (83 kg) Total Weight Gain -4 lb (-1.814 kg) Urinalysis: Urine Protein    Urine Glucose    Fetal Status: Fetal Heart Rate (bpm): 155         General:  Alert, oriented and cooperative. Patient is in no acute distress.  Skin: Skin is warm and dry. No rash noted.   Cardiovascular: Normal heart rate noted  Respiratory: Normal respiratory effort, no problems with respiration noted  Abdomen: Soft, gravid, appropriate for gestational age.       Pelvic:  Cervical exam deferred        Extremities: Normal range of motion.  Edema: None  Mental Status: Normal mood and affect. Normal behavior. Normal judgment and thought content.   Assessment   24 y.o. G2P1001 at [redacted]w[redacted]d by  12/13/2020, by Ultrasound presenting for  routine prenatal visit  Plan   pregnancy2  Problems (from 02/24/20 to present)    Problem Noted Resolved   Supervision of normal pregnancy 05/01/2020 by Tresea Mall, CNM No   Overview Addendum 05/07/2020 11:23 AM by Zipporah Plants, CNM     Nursing Staff Provider  Office Location  [facility] Dating   8w [place]  Language  English Anatomy US    Flu Vaccine   UTD Genetic Screen  declines  TDaP vaccine    Hgb A1C or  GTT Early : Third trimester :   Rhogam     LAB RESULTS   Feeding Plan  Blood Type     Contraception  Antibody    Circumcision  Rubella    Pediatrician   RPR     Support Person  [facility] HBsAg     Prenatal Classes  HIV      Varicella @[email or web address] @   BTL Consent  n/a GBS  (For PCN allergy, check sensitivities)        VBAC Consent  n/a Pap  05/01/20    Hgb Electro      CF      SMA                Previous Version       Preterm labor symptoms and general obstetric precautions including but not limited to vaginal bleeding, contractions, leaking of fluid and fetal movement were reviewed in detail with the patient. Please refer to After Visit Summary for other counseling recommendations.   Return in about 4 weeks (around 07/02/2020) for rob.  07/04/2020, CNM 06/04/2020 9:36 AM

## 2020-06-04 NOTE — Patient Instructions (Signed)

## 2020-06-04 NOTE — Progress Notes (Signed)
No vb. No lof.  

## 2020-07-02 ENCOUNTER — Ambulatory Visit (INDEPENDENT_AMBULATORY_CARE_PROVIDER_SITE_OTHER): Payer: 59 | Admitting: Obstetrics

## 2020-07-02 ENCOUNTER — Other Ambulatory Visit: Payer: Self-pay

## 2020-07-02 VITALS — BP 120/80 | Wt 176.0 lb

## 2020-07-02 DIAGNOSIS — Z3400 Encounter for supervision of normal first pregnancy, unspecified trimester: Secondary | ICD-10-CM

## 2020-07-02 DIAGNOSIS — O0990 Supervision of high risk pregnancy, unspecified, unspecified trimester: Secondary | ICD-10-CM

## 2020-07-02 DIAGNOSIS — Z3A16 16 weeks gestation of pregnancy: Secondary | ICD-10-CM

## 2020-07-02 DIAGNOSIS — O98819 Other maternal infectious and parasitic diseases complicating pregnancy, unspecified trimester: Secondary | ICD-10-CM

## 2020-07-02 DIAGNOSIS — Z3A19 19 weeks gestation of pregnancy: Secondary | ICD-10-CM

## 2020-07-02 DIAGNOSIS — O98811 Other maternal infectious and parasitic diseases complicating pregnancy, first trimester: Secondary | ICD-10-CM

## 2020-07-02 DIAGNOSIS — O26892 Other specified pregnancy related conditions, second trimester: Secondary | ICD-10-CM | POA: Diagnosis not present

## 2020-07-02 DIAGNOSIS — A749 Chlamydial infection, unspecified: Secondary | ICD-10-CM

## 2020-07-02 DIAGNOSIS — R300 Dysuria: Secondary | ICD-10-CM

## 2020-07-02 LAB — POCT URINALYSIS DIPSTICK
Bilirubin, UA: NEGATIVE
Blood, UA: NEGATIVE
Glucose, UA: NEGATIVE
Ketones, UA: NEGATIVE
Nitrite, UA: NEGATIVE
Protein, UA: NEGATIVE
Spec Grav, UA: 1.01 (ref 1.010–1.025)
Urobilinogen, UA: 0.2 E.U./dL
pH, UA: 5 (ref 5.0–8.0)

## 2020-07-02 MED ORDER — AZITHROMYCIN 500 MG PO TABS: 1000.0000 mg | ORAL_TABLET | Freq: Once | ORAL | 0 refills | Status: AC

## 2020-07-02 NOTE — Progress Notes (Signed)
  Routine Prenatal Care Visit  Subjective  Bethany Gonzalez is a 25 y.o. G2P1001 at [redacted]w[redacted]d being seen today for ongoing prenatal care.  She is currently monitored for the following issues for this low-risk pregnancy and has Supervision of normal pregnancy on their problem list.  ----------------------------------------------------------------------------------- Patient reports no bleeding, no contractions, no cramping and she has noticed a strong odor to her urine. Wondering if she has a UTI..    Lockie Pares. Bleeding: None.   . Leaking Fluid denies.  ----------------------------------------------------------------------------------- The following portions of the patient's history were reviewed and updated as appropriate: allergies, current medications, past family history, past medical history, past social history, past surgical history and problem list. Problem list updated.  Objective  Blood pressure 120/80, weight 176 lb (79.8 kg), last menstrual period 02/24/2020. Pregravid weight 183 lb (83 kg) Total Weight Gain -7 lb (-3.175 kg) Urinalysis: Urine Protein    Urine Glucose    Fetal Status:           General:  Alert, oriented and cooperative. Patient is in no acute distress.  Skin: Skin is warm and dry. No rash noted.   Cardiovascular: Normal heart rate noted  Respiratory: Normal respiratory effort, no problems with respiration noted  Abdomen: Soft, gravid, appropriate for gestational age. Pain/Pressure: Present     Pelvic:  Cervical exam deferred        Extremities: Normal range of motion.     Mental Status: Normal mood and affect. Normal behavior. Normal judgment and thought content.   Assessment   25 y.o. G2P1001 at 100w4d by  12/13/2020, by Ultrasound presenting for routine prenatal visit  Plan   pregnancy2  Problems (from 02/24/20 to present)    Problem Noted Resolved   Supervision of normal pregnancy 05/01/2020 by Tresea Mall, CNM No   Overview Addendum 07/02/2020  8:21 AM by  [name], CNM     Nursing Staff Provider  Office Location  [facility] Dating   8w [place]  Language  English Anatomy US    Flu Vaccine   UTD Genetic Screen  declines  TDaP vaccine    Hgb A1C or  GTT Early : Third trimester :   Rhogam     LAB RESULTS   Feeding Plan  Blood Type   O+  Contraception  Antibody  negative  Circumcision  Rubella  immune  Pediatrician   RPR   NR  Support Person  Francisco HBsAg   negative  Prenatal Classes  HIV  NR    Varicella @[email or web address] @   BTL Consent  n/a GBS  (For PCN allergy, check sensitivities)        VBAC Consent  n/a Pap  05/01/20    Hgb Electro      CF      SMA                Previous Version       Preterm labor symptoms and general obstetric precautions including but not limited to vaginal bleeding, contractions, leaking of fluid and fetal movement were reviewed in detail with the patient. Please refer to After Visit Summary for other counseling recommendations.  Her urine shows ++ Leukocytes. Will send for culture and will prescribe antibiotics if she tests positive.  Return in about 4 weeks (around 07/30/2020) for return OB, anatomy scan.  08/01/2020, CNM  07/02/2020 8:22 AM

## 2020-07-02 NOTE — Addendum Note (Signed)
Addended by: Cornelius Moras D on: 07/02/2020 08:51 AM   Modules accepted: Orders

## 2020-07-04 ENCOUNTER — Other Ambulatory Visit: Payer: Self-pay | Admitting: Obstetrics

## 2020-07-04 ENCOUNTER — Encounter: Payer: Self-pay | Admitting: Obstetrics

## 2020-07-04 DIAGNOSIS — O0990 Supervision of high risk pregnancy, unspecified, unspecified trimester: Secondary | ICD-10-CM

## 2020-07-04 LAB — URINE CULTURE, OB REFLEX

## 2020-07-04 LAB — CULTURE, OB URINE

## 2020-07-11 ENCOUNTER — Other Ambulatory Visit: Payer: Self-pay | Admitting: Obstetrics

## 2020-07-11 DIAGNOSIS — O0990 Supervision of high risk pregnancy, unspecified, unspecified trimester: Secondary | ICD-10-CM

## 2020-07-29 ENCOUNTER — Ambulatory Visit: Payer: 59 | Admitting: *Deleted

## 2020-07-29 ENCOUNTER — Other Ambulatory Visit: Payer: Self-pay

## 2020-07-29 ENCOUNTER — Ambulatory Visit: Payer: 59 | Attending: Obstetrics

## 2020-07-29 ENCOUNTER — Encounter: Payer: Self-pay | Admitting: *Deleted

## 2020-07-29 DIAGNOSIS — Z3400 Encounter for supervision of normal first pregnancy, unspecified trimester: Secondary | ICD-10-CM

## 2020-07-29 DIAGNOSIS — O0990 Supervision of high risk pregnancy, unspecified, unspecified trimester: Secondary | ICD-10-CM | POA: Insufficient documentation

## 2020-07-29 DIAGNOSIS — Z3A19 19 weeks gestation of pregnancy: Secondary | ICD-10-CM | POA: Diagnosis not present

## 2020-07-29 DIAGNOSIS — A749 Chlamydial infection, unspecified: Secondary | ICD-10-CM | POA: Diagnosis not present

## 2020-07-29 DIAGNOSIS — O98819 Other maternal infectious and parasitic diseases complicating pregnancy, unspecified trimester: Secondary | ICD-10-CM | POA: Insufficient documentation

## 2020-07-30 ENCOUNTER — Other Ambulatory Visit (HOSPITAL_COMMUNITY)
Admission: RE | Admit: 2020-07-30 | Discharge: 2020-07-30 | Disposition: A | Discharge status: Home / Self Care | Payer: 59 | Source: Ambulatory Visit | Attending: Obstetrics and Gynecology | Admitting: Obstetrics and Gynecology

## 2020-07-30 ENCOUNTER — Ambulatory Visit (INDEPENDENT_AMBULATORY_CARE_PROVIDER_SITE_OTHER): Payer: 59 | Admitting: Obstetrics and Gynecology

## 2020-07-30 ENCOUNTER — Other Ambulatory Visit: Payer: Self-pay | Admitting: Obstetrics and Gynecology

## 2020-07-30 VITALS — BP 112/70 | Ht 64.0 in | Wt 184.0 lb

## 2020-07-30 DIAGNOSIS — Z3402 Encounter for supervision of normal first pregnancy, second trimester: Secondary | ICD-10-CM

## 2020-07-30 DIAGNOSIS — A749 Chlamydial infection, unspecified: Secondary | ICD-10-CM | POA: Insufficient documentation

## 2020-07-30 DIAGNOSIS — O98811 Other maternal infectious and parasitic diseases complicating pregnancy, first trimester: Secondary | ICD-10-CM

## 2020-07-30 DIAGNOSIS — Z3A20 20 weeks gestation of pregnancy: Secondary | ICD-10-CM

## 2020-07-30 DIAGNOSIS — O99891 Other specified diseases and conditions complicating pregnancy: Secondary | ICD-10-CM

## 2020-07-30 DIAGNOSIS — M549 Dorsalgia, unspecified: Secondary | ICD-10-CM

## 2020-07-30 NOTE — Progress Notes (Signed)
Routine Prenatal Care Visit  Subjective  Bethany Gonzalez is a 25 y.o. G2P1001 at [redacted]w[redacted]d being seen today for ongoing prenatal care.  She is currently monitored for the following issues for this low-risk pregnancy and has Supervision of normal pregnancy and Chlamydia infection during pregnancy on their problem list.  ----------------------------------------------------------------------------------- Patient reports backache.   Contractions: Not present. Vag. Bleeding: None.  Movement: Present. Denies leaking of fluid.  ----------------------------------------------------------------------------------- The following portions of the patient's history were reviewed and updated as appropriate: allergies, current medications, past family history, past medical history, past social history, past surgical history and problem list. Problem list updated.   Objective  Blood pressure 112/70, height 5\' 4"  (1.626 m), weight 184 lb (83.5 kg), last menstrual period 02/24/2020. Pregravid weight 183 lb (83 kg) Total Weight Gain 1 lb (0.454 kg) Urinalysis:      Fetal Status:     Movement: Present     General:  Alert, oriented and cooperative. Patient is in no acute distress.  Skin: Skin is warm and dry. No rash noted.   Cardiovascular: Normal heart rate noted  Respiratory: Normal respiratory effort, no problems with respiration noted  Abdomen: Soft, gravid, appropriate for gestational age. Pain/Pressure: Present     Pelvic:  Cervical exam deferred        Extremities: Normal range of motion.     Mental Status: Normal mood and affect. Normal behavior. Normal judgment and thought content.     Assessment   25 y.o. G2P1001 at [redacted]w[redacted]d by  12/13/2020, by Ultrasound presenting for routine prenatal visit  Plan   pregnancy2  Problems (from 02/24/20 to present)    Problem Noted Resolved   Chlamydia infection during pregnancy 07/02/2020 by 07/04/2020, CNM No   Overview Signed 07/02/2020  8:41 AM by 07/04/2020, CNM    Treated at [facility], but vomited the medication . [facility] 4/19. Needs TOC in late May.      Supervision of normal pregnancy 05/01/2020 by 05/03/2020, CNM No   Overview Addendum 07/30/2020  8:39 AM by 08/01/2020, MD     Nursing Staff Provider  Office Location  [facility] Dating   8w Natale Milch  Language  English Anatomy US  Completed- echogenic cardiac focus  Flu Vaccine   UTD Genetic Screen  declines  TDaP vaccine    Hgb A1C or  GTT Early : Third trimester :   Rhogam  Not needed   LAB RESULTS   Feeding Plan  Blood Type   O+  Contraception  Antibody  negative  Circumcision  Rubella  immune  Pediatrician   RPR   NR  Support Person  Francisco HBsAg   negative  Prenatal Classes  HIV  NR    Varicella    BTL Consent  n/a GBS  (For PCN allergy, check sensitivities)        VBAC Consent  n/a Pap  05/01/20    Hgb Electro      CF      SMA                Previous Version       PT referral for back pain in pregnancy TOC for chlamydia today- patient preference to self swab.   Gestational age appropriate obstetric precautions including but not limited to vaginal bleeding, contractions, leaking of fluid and fetal movement were reviewed in detail with the patient.    Return in about 4 weeks (around 08/27/2020) for ROB in person.  Natale Milch MD [facility], [facility] 07/30/2020, 8:39 AM

## 2020-07-30 NOTE — Patient Instructions (Signed)

## 2020-07-31 LAB — CERVICOVAGINAL ANCILLARY ONLY
Chlamydia: NEGATIVE
Comment: NEGATIVE
Comment: NEGATIVE
Comment: NORMAL
Neisseria Gonorrhea: NEGATIVE
Trichomonas: NEGATIVE

## 2020-08-27 ENCOUNTER — Other Ambulatory Visit: Payer: Self-pay

## 2020-08-27 ENCOUNTER — Ambulatory Visit (INDEPENDENT_AMBULATORY_CARE_PROVIDER_SITE_OTHER): Payer: 59 | Admitting: Obstetrics

## 2020-08-27 VITALS — BP 120/60 | Wt 185.0 lb

## 2020-08-27 DIAGNOSIS — Z3A24 24 weeks gestation of pregnancy: Secondary | ICD-10-CM

## 2020-08-27 DIAGNOSIS — O0990 Supervision of high risk pregnancy, unspecified, unspecified trimester: Secondary | ICD-10-CM

## 2020-08-27 LAB — POCT URINALYSIS DIPSTICK OB
Glucose, UA: NEGATIVE
POC,PROTEIN,UA: NEGATIVE

## 2020-08-27 NOTE — Addendum Note (Signed)
Addended by: Donnetta Hail on: 08/27/2020 09:56 AM   Modules accepted: Orders

## 2020-08-27 NOTE — Progress Notes (Signed)
ROB- no concerns 

## 2020-08-27 NOTE — Progress Notes (Signed)
  Routine Prenatal Care Visit  Subjective  Bethany Gonzalez is a 25 y.o. G2P1001 at [redacted]w[redacted]d being seen today for ongoing prenatal care.  She is currently monitored for the following issues for this low-risk pregnancy and has Supervision of normal pregnancy and Chlamydia infection during pregnancy on their problem list.  ----------------------------------------------------------------------------------- Patient reports no complaints.    .  .   Bethany Gonzalez denies.  ----------------------------------------------------------------------------------- The following portions of the patient's history were reviewed and updated as appropriate: allergies, current medications, past family history, past medical history, past social history, past surgical history and problem list. Problem list updated.  Objective  Blood pressure 120/60, weight 185 lb (83.9 kg), last menstrual period 02/24/2020. Pregravid weight 183 lb (83 kg) Total Weight Gain 2 lb (0.907 kg) Urinalysis: Urine Protein    Urine Glucose    Fetal Status:           General:  Alert, oriented and cooperative. Patient is in no acute distress.  Skin: Skin is warm and dry. No rash noted.   Cardiovascular: Normal heart rate noted  Respiratory: Normal respiratory effort, no problems with respiration noted  Abdomen: Soft, gravid, appropriate for gestational age.       Pelvic:  Cervical exam deferred        Extremities: Normal range of motion.     Mental Status: Normal mood and affect. Normal behavior. Normal judgment and thought content.   Assessment   25 y.o. G2P1001 at [redacted]w[redacted]d by  12/13/2020, by Ultrasound presenting for routine prenatal visit  Plan   pregnancy2  Problems (from 02/24/20 to present)    Problem Noted Resolved   Chlamydia infection during pregnancy 07/02/2020 by [name], CNM No   Overview Signed 07/02/2020  8:41 AM by [name], CNM    Treated at [facility], but vomited the medication . [facility] 4/19. Needs TOC in late  May.       Supervision of normal pregnancy 05/01/2020 by Tresea Mall, CNM No   Overview Addendum 07/30/2020  8:39 AM by Natale Milch, MD     Nursing Staff Provider  Office Location  [facility] Dating   8w [place]  Language  English Anatomy US  Completed- echogenic cardiac focus  Flu Vaccine   UTD Genetic Screen  declines  TDaP vaccine    Hgb A1C or  GTT Early : Third trimester :   Rhogam  Not needed   LAB RESULTS   Feeding Plan  Blood Type   O+  Contraception  Antibody  negative  Circumcision  Rubella  immune  Pediatrician   RPR   NR  Support Person  Francisco HBsAg   negative  Prenatal Classes  HIV  NR    Varicella    BTL Consent  n/a GBS  (For PCN allergy, check sensitivities)        VBAC Consent  n/a Pap  05/01/20    Hgb Electro      CF      SMA                     Preterm labor symptoms and general obstetric precautions including but not limited to vaginal bleeding, contractions, leaking of Gonzalez and fetal movement were reviewed in detail with the patient. Please refer to After Visit Summary for other counseling recommendations.  RTC in 4 weeks for GTT and labs.  No follow-ups on file.  [name], CNM  08/27/2020 9:42 AM

## 2020-09-24 ENCOUNTER — Ambulatory Visit (INDEPENDENT_AMBULATORY_CARE_PROVIDER_SITE_OTHER): Payer: 59 | Admitting: Obstetrics and Gynecology

## 2020-09-24 ENCOUNTER — Encounter: Payer: Self-pay | Admitting: Obstetrics and Gynecology

## 2020-09-24 ENCOUNTER — Other Ambulatory Visit: Payer: 59

## 2020-09-24 ENCOUNTER — Other Ambulatory Visit: Payer: Self-pay

## 2020-09-24 VITALS — BP 110/72 | Ht 64.0 in | Wt 186.4 lb

## 2020-09-24 DIAGNOSIS — O0990 Supervision of high risk pregnancy, unspecified, unspecified trimester: Secondary | ICD-10-CM

## 2020-09-24 DIAGNOSIS — Z3403 Encounter for supervision of normal first pregnancy, third trimester: Secondary | ICD-10-CM

## 2020-09-24 DIAGNOSIS — O210 Mild hyperemesis gravidarum: Secondary | ICD-10-CM

## 2020-09-24 DIAGNOSIS — Z3A28 28 weeks gestation of pregnancy: Secondary | ICD-10-CM

## 2020-09-24 LAB — POCT URINALYSIS DIPSTICK OB
Glucose, UA: NEGATIVE
POC,PROTEIN,UA: NEGATIVE

## 2020-09-24 MED ORDER — METOCLOPRAMIDE HCL 10 MG PO TABS
10.0000 mg | ORAL_TABLET | Freq: Three times a day (TID) | ORAL | 2 refills | Status: DC
Start: 2020-09-24 — End: 2020-12-08

## 2020-09-24 NOTE — Patient Instructions (Addendum)
[email or web address] Initial steps to help :   B6 (pyridoxine) 25 mg,  3-4 times a day- 200 mg a day total Unisom (doxylamine) 25 mg at bedtime **B6 and Unisom are available as a combination prescription medications called diclegis and bonjesta  B1 (thiamin)  50-100 mg 1-2 a day-  100 mg a day total  Continue prenatal vitamin with iron and thiamin. If it is not tolerated switch to 1 mg of folic acid.  Can add medication for gastric reflux if needed.  Subsequent steps to be added to B1, B6, and Unisom:  Antihistamine (one of the following medications) Dramamine      25-50 mg every 4-6 hours Benadryl      25-50 mg every 4-6 hours Meclizine      25 mg every 6 hours  2. Dopamine Antagonist (one of the following medications) Metoclopramide  (Reglan)  5-10 mg every 6-8 hours         PO Promethazine   (Phenergan)   12.5-25 mg every 4-6 hours      PO or rectal Prochlorperazine  (Compazine)  5-10 mg every 6-8 hours     25mg  BID rectally   Subsequent steps if there has still not been improvement in symptoms:  3. Daily stool softner:  Colace 100 mg twice a day  4. Ondansetron  (Zofran)   4-8 mg every 6-8 hours      Third Trimester of Pregnancy  The third trimester of pregnancy is from week 28 through week 40. This is months 7 through 9. The third trimester is a time when the unborn baby (fetus) is growing rapidly. At the end of the ninth month, the fetus is about 20inches long and weighs 6-10 pounds. Body changes during your third trimester During the third trimester, your body will continue to go through many changes.The changes vary and generally return to normal after your baby is born. Physical changes Your weight will continue to increase. You can expect to gain 25-35 pounds (11-16 kg) by the end of the pregnancy if you begin pregnancy at a normal weight. If you are underweight, you can expect to gain 28-40 lb (about 13-18 kg), and if you are overweight, you can expect  to gain 15-25 lb (about 7-11 kg). You may begin to get stretch marks on your hips, abdomen, and breasts. Your breasts will continue to grow and may hurt. A yellow fluid (colostrum) may leak from your breasts. This is the first milk you are producing for your baby. You may have changes in your hair. These can include thickening of your hair, rapid growth, and changes in texture. Some people also have hair loss during or after pregnancy, or hair that feels dry or thin. Your belly button may stick out. You may notice more swelling in your hands, face, or ankles. Health changes You may have heartburn. You may have constipation. You may develop hemorrhoids. You may develop swollen, bulging veins (varicose veins) in your legs. You may have increased body aches in the pelvis, back, or thighs. This is due to weight gain and increased hormones that are relaxing your joints. You may have increased tingling or numbness in your hands, arms, and legs. The skin on your abdomen may also feel numb. You may feel short of breath because of your expanding uterus. Other changes You may urinate more often because the fetus is moving lower into your pelvis and pressing on your bladder. You may have more problems sleeping. This may be caused by  the size of your abdomen, an increased need to urinate, and an increase in your body's metabolism. You may notice the fetus "dropping," or moving lower in your abdomen (lightening). You may have increased vaginal discharge. You may notice that you have pain around your pelvic bone as your uterus distends. Follow these instructions at home: Medicines Follow your health care provider's instructions regarding medicine use. Specific medicines may be either safe or unsafe to take during pregnancy. Do not take any medicines unless approved by your health care provider. Take a prenatal vitamin that contains at least 600 micrograms (mcg) of folic acid. Eating and drinking Eat a  healthy diet that includes fresh fruits and vegetables, whole grains, good sources of protein such as meat, eggs, or tofu, and low-fat dairy products. Avoid raw meat and unpasteurized juice, milk, and cheese. These carry germs that can harm you and your baby. Eat 4 or 5 small meals rather than 3 large meals a day. You may need to take these actions to prevent or treat constipation: Drink enough fluid to keep your urine pale yellow. Eat foods that are high in fiber, such as beans, whole grains, and fresh fruits and vegetables. Limit foods that are high in fat and processed sugars, such as fried or sweet foods. Activity Exercise only as directed by your health care provider. Most people can continue their usual exercise routine during pregnancy. Try to exercise for 30 minutes at least 5 days a week. Stop exercising if you experience contractions in the uterus. Stop exercising if you develop pain or cramping in the lower abdomen or lower back. Avoid heavy lifting. Do not exercise if it is very hot or humid or if you are at a high altitude. If you choose to, you may continue to have sex unless your health care provider tells you not to. Relieving pain and discomfort Take frequent breaks and rest with your legs raised (elevated) if you have leg cramps or low back pain. Take warm sitz baths to soothe any pain or discomfort caused by hemorrhoids. Use hemorrhoid cream if your health care provider approves. Wear a supportive bra to prevent discomfort from breast tenderness. If you develop varicose veins: Wear support hose as told by your health care provider. Elevate your feet for 15 minutes, 3-4 times a day. Limit salt in your diet. Safety Talk to your health care provider before traveling far distances. Do not use hot tubs, steam rooms, or saunas. Wear your seat belt at all times when driving or riding in a car. Talk with your health care provider if someone is verbally or physically abusive to  you. Preparing for birth To prepare for the arrival of your baby: Take prenatal classes to understand, practice, and ask questions about labor and delivery. Visit the hospital and tour the maternity area. Purchase a rear-facing car seat and make sure you know how to install it in your car. Prepare the baby's room or sleeping area. Make sure to remove all pillows and stuffed animals from the baby's crib to prevent suffocation. General instructions Avoid cat litter boxes and soil used by cats. These carry germs that can cause birth defects in the baby. If you have a cat, ask someone to clean the litter box for you. Do not douche or use tampons. Do not use scented sanitary pads. Do not use any products that contain nicotine or tobacco, such as cigarettes, e-cigarettes, and chewing tobacco. If you need help quitting, ask your health care provider. Do not  use any herbal remedies, illegal drugs, or medicines that were not prescribed to you. Chemicals in these products can harm your baby. Do not drink alcohol. You will have more frequent prenatal exams during the third trimester. During a routine prenatal visit, your health care provider will do a physical exam, perform tests, and discuss your overall health. Keep all follow-up visits. This is important. Where to find more information American Pregnancy Association: americanpregnancy.org [employer]: https://www.todd-brady.net/ Office on [employer] Health: [employer].nz Contact a health care provider if you have: A fever. Mild pelvic cramps, pelvic pressure, or nagging pain in your abdominal area or lower back. Vomiting or diarrhea. Bad-smelling vaginal discharge or foul-smelling urine. Pain when you urinate. A headache that does not go away when you take medicine. Visual changes or see spots in front of your eyes. Get help right away if: Your water breaks. You have regular contractions  less than 5 minutes apart. You have spotting or bleeding from your vagina. You have severe abdominal pain. You have difficulty breathing. You have chest pain. You have fainting spells. You have not felt your baby move for the time period told by your health care provider. You have new or increased pain, swelling, or redness in an arm or leg. Summary The third trimester of pregnancy is from week 28 through week 40 (months 7 through 9). You may have more problems sleeping. This can be caused by the size of your abdomen, an increased need to urinate, and an increase in your body's metabolism. You will have more frequent prenatal exams during the third trimester. Keep all follow-up visits. This is important. This information is not intended to replace advice given to you by your health care provider. Make sure you discuss any questions you have with your healthcare provider. Document Revised: 08/09/2019 Document Reviewed: 06/15/2019 Elsevier Patient Education  2022 [employer].

## 2020-09-24 NOTE — Progress Notes (Signed)
Routine Prenatal Care Visit  Subjective  Bethany Gonzalez is a 25 y.o. G2P1001 at [redacted]w[redacted]d being seen today for ongoing prenatal care.  She is currently monitored for the following issues for this low-risk pregnancy and has Supervision of normal pregnancy and Chlamydia infection during pregnancy on their problem list.  ----------------------------------------------------------------------------------- Patient reports backache and vomiting.   Contractions: Not present. Vag. Bleeding: None.  Movement: Present. Denies leaking of fluid.  ----------------------------------------------------------------------------------- The following portions of the patient's history were reviewed and updated as appropriate: allergies, current medications, past family history, past medical history, past social history, past surgical history and problem list. Problem list updated.   Objective  Blood pressure 110/72, height 5\' 4"  (1.626 m), weight 186 lb 6.4 oz (84.6 kg), last menstrual period 02/24/2020. Pregravid weight 183 lb (83 kg) Total Weight Gain 3 lb 6.4 oz (1.542 kg) Urinalysis:      Fetal Status: Fetal Heart Rate (bpm): 130 Fundal Height: 28 cm Movement: Present     General:  Alert, oriented and cooperative. Patient is in no acute distress.  Skin: Skin is warm and dry. No rash noted.   Cardiovascular: Normal heart rate noted  Respiratory: Normal respiratory effort, no problems with respiration noted  Abdomen: Soft, gravid, appropriate for gestational age. Pain/Pressure: Present     Pelvic:  Cervical exam deferred        Extremities: Normal range of motion.  Edema: None  Mental Status: Normal mood and affect. Normal behavior. Normal judgment and thought content.     Assessment   25 y.o. G2P1001 at [redacted]w[redacted]d by  12/13/2020, by Ultrasound presenting for routine prenatal visit  Plan   pregnancy2  Problems (from 02/24/20 to present)     Problem Noted Resolved   Chlamydia infection during pregnancy  07/02/2020 by 07/04/2020, CNM No   Overview Signed 07/02/2020  8:41 AM by 07/04/2020, CNM    Treated at [facility], but vomited the medication . [facility] 4/19. Needs TOC in late May.       Supervision of normal pregnancy 05/01/2020 by 05/03/2020, CNM No   Overview Addendum 09/24/2020  9:08 AM by 11/25/2020, MD     Nursing Staff Provider  Office Location  [facility] Dating   8w Natale Milch  Language  English Anatomy US  Completed- echogenic cardiac focus  Flu Vaccine   UTD Genetic Screen  declines  TDaP vaccine    Hgb A1C or  GTT Early: 96 Third trimester :   Rhogam  Not needed   LAB RESULTS   Feeding Plan Breast Blood Type   O+  Contraception Condoms Antibody  negative  Circumcision  Rubella  immune  Pediatrician   RPR   NR  Support Person  Francisco HBsAg   negative  Prenatal Classes  HIV  NR    Varicella  Immune  BTL Consent  n/a GBS  (For PCN allergy, check sensitivities)        VBAC Consent  n/a Pap  05/01/20    Hgb Electro      CF      SMA        Chlamydia + in pregnancy- TOC negative             Discussed medication for nausea/vomiting in pregnancy- she is vomiting usually once a day but phenergan made her feel worse thought the day so she discontinued it. Will trial reglan. Reviewed plan for hyperemesis and resources. Not able to see PT yet for  sciatic back pain- she has been busy with work, belly band helps. Discussed spinning babies daily activities to manage pain at home if not able to see PT.  28 week labs today.   Gestational age appropriate obstetric precautions including but not limited to vaginal bleeding, contractions, leaking of fluid and fetal movement were reviewed in detail with the patient.    Return in about 2 weeks (around 10/08/2020) for ROB in person.  Natale Milch MD [facility], [facility] 09/24/2020, 9:08 AM

## 2020-09-25 LAB — 28 WEEK RH+PANEL
Basophils Absolute: 0 10*3/uL (ref 0.0–0.2)
Basos: 0 %
EOS (ABSOLUTE): 0 10*3/uL (ref 0.0–0.4)
Eos: 0 %
Gestational Diabetes Screen: 96 mg/dL (ref 65–139)
HIV Screen 4th Generation wRfx: NONREACTIVE
Hematocrit: 32.3 % — ABNORMAL LOW (ref 34.0–46.6)
Hemoglobin: 11 g/dL — ABNORMAL LOW (ref 11.1–15.9)
Immature Grans (Abs): 0 10*3/uL (ref 0.0–0.1)
Immature Granulocytes: 1 %
Lymphocytes Absolute: 1.5 10*3/uL (ref 0.7–3.1)
Lymphs: 18 %
MCH: 30.4 pg (ref 26.6–33.0)
MCHC: 34.1 g/dL (ref 31.5–35.7)
MCV: 89 fL (ref 79–97)
Monocytes Absolute: 0.4 10*3/uL (ref 0.1–0.9)
Monocytes: 5 %
Neutrophils Absolute: 6.5 10*3/uL (ref 1.4–7.0)
Neutrophils: 76 %
Platelets: 181 10*3/uL (ref 150–450)
RBC: 3.62 x10E6/uL — ABNORMAL LOW (ref 3.77–5.28)
RDW: 12.8 % (ref 11.7–15.4)
RPR Ser Ql: NONREACTIVE
WBC: 8.4 10*3/uL (ref 3.4–10.8)

## 2020-10-08 ENCOUNTER — Other Ambulatory Visit: Payer: Self-pay

## 2020-10-08 ENCOUNTER — Other Ambulatory Visit (HOSPITAL_COMMUNITY)
Admission: RE | Admit: 2020-10-08 | Discharge: 2020-10-08 | Disposition: A | Discharge status: Home / Self Care | Payer: 59 | Source: Ambulatory Visit | Attending: Obstetrics | Admitting: Obstetrics

## 2020-10-08 ENCOUNTER — Ambulatory Visit (INDEPENDENT_AMBULATORY_CARE_PROVIDER_SITE_OTHER): Payer: 59 | Admitting: Obstetrics

## 2020-10-08 VITALS — BP 120/70 | Ht 64.0 in | Wt 191.2 lb

## 2020-10-08 DIAGNOSIS — A749 Chlamydial infection, unspecified: Secondary | ICD-10-CM | POA: Insufficient documentation

## 2020-10-08 DIAGNOSIS — Z3403 Encounter for supervision of normal first pregnancy, third trimester: Secondary | ICD-10-CM

## 2020-10-08 DIAGNOSIS — O98811 Other maternal infectious and parasitic diseases complicating pregnancy, first trimester: Secondary | ICD-10-CM | POA: Diagnosis not present

## 2020-10-08 DIAGNOSIS — Z3A30 30 weeks gestation of pregnancy: Secondary | ICD-10-CM | POA: Insufficient documentation

## 2020-10-08 DIAGNOSIS — Z23 Encounter for immunization: Secondary | ICD-10-CM | POA: Diagnosis not present

## 2020-10-08 LAB — POCT URINALYSIS DIPSTICK OB
Glucose, UA: NEGATIVE
POC,PROTEIN,UA: NEGATIVE

## 2020-10-08 NOTE — Progress Notes (Signed)
Routine Prenatal Care Visit  Subjective  Bethany Gonzalez is a 25 y.o. G2P1001 at [redacted]w[redacted]d being seen today for ongoing prenatal care.  She is currently monitored for the following issues for this low-risk pregnancy and has Supervision of normal pregnancy and Chlamydia infection during pregnancy on their problem list.  ----------------------------------------------------------------------------------- Patient reports heartburn and nausea.  She continues to vomit daily. Using Reglan.  Contractions: Irregular. Vag. Bleeding: None.  Movement: Present. Leaking Fluid denies.  ----------------------------------------------------------------------------------- The following portions of the patient's history were reviewed and updated as appropriate: allergies, current medications, past family history, past medical history, past social history, past surgical history and problem list. Problem list updated.  Objective  Blood pressure 120/70, height 5\' 4"  (1.626 m), weight 191 lb 3.2 oz (86.7 kg), last menstrual period 02/24/2020. Pregravid weight 183 lb (83 kg) Total Weight Gain 8 lb 3.2 oz (3.719 kg) Urinalysis: Urine Protein    Urine Glucose    Fetal Status:     Movement: Present     General:  Alert, oriented and cooperative. Patient is in no acute distress.  Skin: Skin is warm and dry. No rash noted.   Cardiovascular: Normal heart rate noted  Respiratory: Normal respiratory effort, no problems with respiration noted  Abdomen: Soft, gravid, appropriate for gestational age. Pain/Pressure: Present     Pelvic:  Cervical exam deferred        Extremities: Normal range of motion.     Mental Status: Normal mood and affect. Normal behavior. Normal judgment and thought content.   Assessment   25 y.o. G2P1001 at [redacted]w[redacted]d by  12/13/2020, by Ultrasound presenting for routine prenatal visit  Plan   pregnancy2  Problems (from 02/24/20 to present)    Problem Noted Resolved   Chlamydia infection during pregnancy  07/02/2020 by 07/04/2020, CNM No   Overview Signed 07/02/2020  8:41 AM by 07/04/2020, CNM    Treated at [facility], but vomited the medication . [facility] 4/19. Needs TOC in late May.      Supervision of normal pregnancy 05/01/2020 by 05/03/2020, CNM No   Overview Addendum 09/26/2020  8:44 AM by 09/28/2020, CNM     Nursing Staff Provider  Office Location  [facility] Dating   8w Bethany Gonzalez  Language  English Anatomy US  Completed- echogenic cardiac focus  Flu Vaccine   UTD Genetic Screen  declines  TDaP vaccine    Hgb A1C or  GTT Early: 96 Third trimester : 96  Rhogam  Not needed   LAB RESULTS   Feeding Plan Breast Blood Type   O+  Contraception Condoms Antibody  negative  Circumcision  Rubella  immune  Pediatrician   RPR   NR  Support Person  Francisco HBsAg   negative  Prenatal Classes  HIV  NR    Varicella  Immune  BTL Consent  n/a GBS  (For PCN allergy, check sensitivities)        VBAC Consent  n/a Pap  05/01/20    Hgb Electro      CF      SMA         Chlamydia + in pregnancy- TOC negative           Preterm labor symptoms and general obstetric precautions including but not limited to vaginal bleeding, contractions, leaking of fluid and fetal movement were reviewed in detail with the patient. Please refer to After Visit Summary for other counseling recommendations.  Advised protein before bed. Gatorade semi frozen  to take by spoonfuls. TOC for Chlamydia retrieved today.  Return in about 2 weeks (around 10/22/2020) for return OB.  Bethany Gonzalez, CNM  10/08/2020 8:36 AM

## 2020-10-10 LAB — CERVICOVAGINAL ANCILLARY ONLY
Chlamydia: NEGATIVE
Comment: NEGATIVE
Comment: NORMAL
Neisseria Gonorrhea: NEGATIVE

## 2020-10-22 ENCOUNTER — Other Ambulatory Visit: Payer: Self-pay

## 2020-10-22 ENCOUNTER — Ambulatory Visit (INDEPENDENT_AMBULATORY_CARE_PROVIDER_SITE_OTHER): Payer: 59 | Admitting: Obstetrics

## 2020-10-22 VITALS — BP 100/60 | Wt 188.0 lb

## 2020-10-22 DIAGNOSIS — Z3403 Encounter for supervision of normal first pregnancy, third trimester: Secondary | ICD-10-CM

## 2020-10-22 DIAGNOSIS — Z3A32 32 weeks gestation of pregnancy: Secondary | ICD-10-CM

## 2020-10-22 LAB — POCT URINALYSIS DIPSTICK OB
Glucose, UA: NEGATIVE
POC,PROTEIN,UA: NEGATIVE

## 2020-10-22 NOTE — Progress Notes (Signed)
Routine Prenatal Care Visit  Subjective  Bethany Gonzalez is a 25 y.o. G2P1001 at [redacted]w[redacted]d being seen today for ongoing prenatal care.  She is currently monitored for the following issues for this low-risk pregnancy and has Supervision of normal pregnancy and Chlamydia infection during pregnancy on their problem list.  ----------------------------------------------------------------------------------- Patient reports no complaints.    .  .   [name] denies.  ----------------------------------------------------------------------------------- The following portions of the patient's history were reviewed and updated as appropriate: allergies, current medications, past family history, past medical history, past social history, past surgical history and problem list. Problem list updated.  Objective  Blood pressure 100/60, weight 188 lb (85.3 kg), last menstrual period 02/24/2020. Pregravid weight 183 lb (83 kg) Total Weight Gain 5 lb (2.268 kg) Urinalysis: Urine Protein    Urine Glucose    Fetal Status:           General:  Alert, oriented and cooperative. Patient is in no acute distress.  Skin: Skin is warm and dry. No rash noted.   Cardiovascular: Normal heart rate noted  Respiratory: Normal respiratory effort, no problems with respiration noted  Abdomen: Soft, gravid, appropriate for gestational age.       Pelvic:  Cervical exam deferred        Extremities: Normal range of motion.     Mental Status: Normal mood and affect. Normal behavior. Normal judgment and thought content.   Assessment   25 y.o. G2P1001 at [redacted]w[redacted]d by  12/13/2020, by Ultrasound presenting for routine prenatal visit  Plan   pregnancy2  Problems (from 02/24/20 to present)    Problem Noted Resolved   Chlamydia infection during pregnancy 07/02/2020 by [name], CNM No   Overview Signed 07/02/2020  8:41 AM by [name], CNM    Treated at [facility], but vomited the medication . [facility] 4/19. Needs TOC in late  May.      Supervision of normal pregnancy 05/01/2020 by Tresea Mall, CNM No   Overview Addendum 09/26/2020  8:44 AM by [name], CNM     Nursing Staff Provider  Office Location  [facility] Dating   8w [place]  Language  English Anatomy US  Completed- echogenic cardiac focus  Flu Vaccine   UTD Genetic Screen  declines  TDaP vaccine    Hgb A1C or  GTT Early: 96 Third trimester : 96  Rhogam  Not needed   LAB RESULTS   Feeding Plan Breast Blood Type   O+  Contraception Condoms Antibody  negative  Circumcision  Rubella  immune  Pediatrician   RPR   NR  Support Person  Francisco HBsAg   negative  Prenatal Classes  HIV  NR    Varicella  Immune  BTL Consent  n/a GBS  (For PCN allergy, check sensitivities)        VBAC Consent  n/a Pap  05/01/20    Hgb Electro      CF      SMA         Chlamydia + in pregnancy- TOC negative           Preterm labor symptoms and general obstetric precautions including but not limited to vaginal bleeding, contractions, leaking of fluid and fetal movement were reviewed in detail with the patient. Please refer to After Visit Summary for other counseling recommendations.  She has received her tdap  Return in about 2 weeks (around 11/05/2020) for return OB.  [name], CNM  10/22/2020 10:39 AM

## 2020-10-22 NOTE — Progress Notes (Signed)
ROB- no concerns 

## 2020-10-22 NOTE — Addendum Note (Signed)
Addended by: Donnetta Hail on: 10/22/2020 11:02 AM   Modules accepted: Orders

## 2020-11-05 ENCOUNTER — Ambulatory Visit (INDEPENDENT_AMBULATORY_CARE_PROVIDER_SITE_OTHER): Payer: 59 | Admitting: Advanced Practice Midwife

## 2020-11-05 ENCOUNTER — Other Ambulatory Visit: Payer: Self-pay

## 2020-11-05 VITALS — BP 100/72 | Ht 64.0 in | Wt 195.0 lb

## 2020-11-05 DIAGNOSIS — Z3A34 34 weeks gestation of pregnancy: Secondary | ICD-10-CM

## 2020-11-05 DIAGNOSIS — Z3483 Encounter for supervision of other normal pregnancy, third trimester: Secondary | ICD-10-CM

## 2020-11-05 LAB — POCT URINALYSIS DIPSTICK OB
Glucose, UA: NEGATIVE
POC,PROTEIN,UA: NEGATIVE

## 2020-11-05 NOTE — Progress Notes (Signed)
Routine Prenatal Care Visit  Subjective  Bethany Gonzalez is a 25 y.o. G2P1001 at [redacted]w[redacted]d being seen today for ongoing prenatal care.  She is currently monitored for the following issues for this low-risk pregnancy and has Supervision of normal pregnancy and Chlamydia infection during pregnancy on their problem list.  ----------------------------------------------------------------------------------- Patient reports no complaints.   Contractions: Irregular. Vag. Bleeding: None.  Movement: Present. Leaking Fluid denies.  ----------------------------------------------------------------------------------- The following portions of the patient's history were reviewed and updated as appropriate: allergies, current medications, past family history, past medical history, past social history, past surgical history and problem list. Problem list updated.  Objective  Blood pressure 100/72, height 5\' 4"  (1.626 m), weight 195 lb (88.5 kg), last menstrual period 02/24/2020. Pregravid weight 183 lb (83 kg) Total Weight Gain 12 lb (5.443 kg) Urinalysis: Urine Protein    Urine Glucose    Fetal Status: Fetal Heart Rate (bpm): 131 Fundal Height: 34 cm Movement: Present     General:  Alert, oriented and cooperative. Patient is in no acute distress.  Skin: Skin is warm and dry. No rash noted.   Cardiovascular: Normal heart rate noted  Respiratory: Normal respiratory effort, no problems with respiration noted  Abdomen: Soft, gravid, appropriate for gestational age. Pain/Pressure: Present     Pelvic:  Cervical exam deferred        Extremities: Normal range of motion.  Edema: None  Mental Status: Normal mood and affect. Normal behavior. Normal judgment and thought content.   Assessment   25 y.o. G2P1001 at [redacted]w[redacted]d by  12/13/2020, by Ultrasound presenting for routine prenatal visit  Plan   pregnancy2  Problems (from 02/24/20 to present)    Problem Noted Resolved   Chlamydia infection during pregnancy 07/02/2020  by 07/04/2020, CNM No   Overview Signed 07/02/2020  8:41 AM by 07/04/2020, CNM    Treated at [facility], but vomited the medication . [facility] 4/19. Needs TOC in late May.      Supervision of normal pregnancy 05/01/2020 by 05/03/2020, CNM No   Overview Addendum 09/26/2020  8:44 AM by 09/28/2020, CNM     Nursing Staff Provider  Office Location  [facility] Dating   8w [name]  Language  English Anatomy US  Completed- echogenic cardiac focus  Flu Vaccine   UTD Genetic Screen  declines  TDaP vaccine    Hgb A1C or  GTT Early: 96 Third trimester : 96  Rhogam  Not needed   LAB RESULTS   Feeding Plan Breast Blood Type   O+  Contraception Condoms Antibody  negative  Circumcision  Rubella  immune  Pediatrician   RPR   NR  Support Person  Francisco HBsAg   negative  Prenatal Classes  HIV  NR    Varicella  Immune  BTL Consent  n/a GBS  (For PCN allergy, check sensitivities)        VBAC Consent  n/a Pap  05/01/20    Hgb Electro      CF      SMA         Chlamydia + in pregnancy- TOC negative           Preterm labor symptoms and general obstetric precautions including but not limited to vaginal bleeding, contractions, leaking of fluid and fetal movement were reviewed in detail with the patient. Please refer to After Visit Summary for other counseling recommendations.   Return in about 2 weeks (around 11/19/2020) for rob.  01/19/2021, CNM  11/05/2020 8:29 AM

## 2020-11-20 ENCOUNTER — Ambulatory Visit (INDEPENDENT_AMBULATORY_CARE_PROVIDER_SITE_OTHER): Payer: 59 | Admitting: Advanced Practice Midwife

## 2020-11-20 ENCOUNTER — Other Ambulatory Visit (HOSPITAL_COMMUNITY)
Admission: RE | Admit: 2020-11-20 | Discharge: 2020-11-20 | Disposition: A | Discharge status: Home / Self Care | Payer: 59 | Source: Ambulatory Visit | Attending: Advanced Practice Midwife | Admitting: Advanced Practice Midwife

## 2020-11-20 ENCOUNTER — Other Ambulatory Visit: Payer: Self-pay

## 2020-11-20 ENCOUNTER — Encounter: Payer: Self-pay | Admitting: Advanced Practice Midwife

## 2020-11-20 VITALS — BP 100/70 | Wt 197.0 lb

## 2020-11-20 DIAGNOSIS — Z113 Encounter for screening for infections with a predominantly sexual mode of transmission: Secondary | ICD-10-CM

## 2020-11-20 DIAGNOSIS — Z3A36 36 weeks gestation of pregnancy: Secondary | ICD-10-CM | POA: Insufficient documentation

## 2020-11-20 DIAGNOSIS — Z369 Encounter for antenatal screening, unspecified: Secondary | ICD-10-CM | POA: Insufficient documentation

## 2020-11-20 DIAGNOSIS — Z3685 Encounter for antenatal screening for Streptococcus B: Secondary | ICD-10-CM | POA: Diagnosis not present

## 2020-11-20 DIAGNOSIS — Z3483 Encounter for supervision of other normal pregnancy, third trimester: Secondary | ICD-10-CM | POA: Insufficient documentation

## 2020-11-20 NOTE — Progress Notes (Signed)
  Routine Prenatal Care Visit  Subjective  Bethany Gonzalez is a 25 y.o. G2P1001 at [redacted]w[redacted]d being seen today for ongoing prenatal care.  She is currently monitored for the following issues for this low-risk pregnancy and has Supervision of normal pregnancy and Chlamydia infection during pregnancy on their problem list.  ----------------------------------------------------------------------------------- Patient reports no complaints.   Contractions: Irregular. Vag. Bleeding: None.  Movement: Present. Leaking Fluid denies.  ----------------------------------------------------------------------------------- The following portions of the patient's history were reviewed and updated as appropriate: allergies, current medications, past family history, past medical history, past social history, past surgical history and problem list. Problem list updated.  Objective  Blood pressure 100/70, weight 197 lb (89.4 kg), last menstrual period 02/24/2020. Pregravid weight 183 lb (83 kg) Total Weight Gain 14 lb (6.35 kg) Urinalysis: Urine Protein    Urine Glucose    Fetal Status: Fetal Heart Rate (bpm): 127 Fundal Height: 36 cm Movement: Present  Presentation: Vertex  General:  Alert, oriented and cooperative. Patient is in no acute distress.  Skin: Skin is warm and dry. No rash noted.   Cardiovascular: Normal heart rate noted  Respiratory: Normal respiratory effort, no problems with respiration noted  Abdomen: Soft, gravid, appropriate for gestational age. Pain/Pressure: Present     Pelvic:  GBS/aptima collected Dilation: 1 Effacement (%): 30 Station: -3  Extremities: Normal range of motion.  Edema: None  Mental Status: Normal mood and affect. Normal behavior. Normal judgment and thought content.   Assessment   25 y.o. G2P1001 at [redacted]w[redacted]d by  12/13/2020, by Ultrasound presenting for routine prenatal visit  Plan   pregnancy2  Problems (from 02/24/20 to present)    Problem Noted Resolved   Chlamydia  infection during pregnancy 07/02/2020 by [name], CNM No   Overview Signed 07/02/2020  8:41 AM by [name], CNM    Treated at [facility], but vomited the medication . [facility] 4/19. Needs TOC in late May.      Supervision of normal pregnancy 05/01/2020 by Tresea Mall, CNM No   Overview Addendum 09/26/2020  8:44 AM by [name], CNM     Nursing Staff Provider  Office Location  [facility] Dating   8w [place]  Language  English Anatomy US  Completed- echogenic cardiac focus  Flu Vaccine   UTD Genetic Screen  declines  TDaP vaccine    Hgb A1C or  GTT Early: 96 Third trimester : 96  Rhogam  Not needed   LAB RESULTS   Feeding Plan Breast Blood Type   O+  Contraception Condoms Antibody  negative  Circumcision  Rubella  immune  Pediatrician   RPR   NR  Support Person  Francisco HBsAg   negative  Prenatal Classes  HIV  NR    Varicella  Immune  BTL Consent  n/a GBS  (For PCN allergy, check sensitivities)        VBAC Consent  n/a Pap  05/01/20    Hgb Electro      CF      SMA         Chlamydia + in pregnancy- TOC negative           Preterm labor symptoms and general obstetric precautions including but not limited to vaginal bleeding, contractions, leaking of fluid and fetal movement were reviewed in detail with the patient.    Return in about 1 week (around 11/27/2020) for rob.  Tresea Mall, CNM 11/20/2020 11:18 AM

## 2020-11-21 LAB — CERVICOVAGINAL ANCILLARY ONLY
Chlamydia: NEGATIVE
Comment: NEGATIVE
Comment: NEGATIVE
Comment: NORMAL
Neisseria Gonorrhea: NEGATIVE
Trichomonas: NEGATIVE

## 2020-11-22 LAB — STREP GP B NAA: Strep Gp B NAA: NEGATIVE

## 2020-11-28 ENCOUNTER — Other Ambulatory Visit: Payer: Self-pay

## 2020-11-28 ENCOUNTER — Ambulatory Visit (INDEPENDENT_AMBULATORY_CARE_PROVIDER_SITE_OTHER): Payer: 59 | Admitting: Obstetrics and Gynecology

## 2020-11-28 VITALS — BP 120/70 | Wt 199.6 lb

## 2020-11-28 DIAGNOSIS — Z3A37 37 weeks gestation of pregnancy: Secondary | ICD-10-CM

## 2020-11-28 DIAGNOSIS — Z3483 Encounter for supervision of other normal pregnancy, third trimester: Secondary | ICD-10-CM

## 2020-11-28 NOTE — Progress Notes (Signed)
Routine Prenatal Care Visit  Subjective  Bethany Gonzalez is a 25 y.o. G2P1001 at [redacted]w[redacted]d being seen today for ongoing prenatal care.  She is currently monitored for the following issues for this low-risk pregnancy and has Supervision of normal pregnancy and Chlamydia infection during pregnancy on their problem list.  ----------------------------------------------------------------------------------- Patient reports occasional contractions.   Contractions: Irritability. Vag. Bleeding: None.  Movement: Present. Denies leaking of fluid.  ----------------------------------------------------------------------------------- The following portions of the patient's history were reviewed and updated as appropriate: allergies, current medications, past family history, past medical history, past social history, past surgical history and problem list. Problem list updated.   Objective  Blood pressure 120/70, weight 199 lb 9.6 oz (90.5 kg), last menstrual period 02/24/2020. Pregravid weight 183 lb (83 kg) Total Weight Gain 16 lb 9.6 oz (7.53 kg) Urinalysis:      Fetal Status: Fetal Heart Rate (bpm): 140 Fundal Height: 36 cm Movement: Present     General:  Alert, oriented and cooperative. Patient is in no acute distress.  Skin: Skin is warm and dry. No rash noted.   Cardiovascular: Normal heart rate noted  Respiratory: Normal respiratory effort, no problems with respiration noted  Abdomen: Soft, gravid, appropriate for gestational age. Pain/Pressure: Present     Pelvic:  Cervical exam performed Dilation: 1 Effacement (%): 30 Station: -3  Extremities: Normal range of motion.  Edema: None  Mental Status: Normal mood and affect. Normal behavior. Normal judgment and thought content.     Assessment   25 y.o. G2P1001 at [redacted]w[redacted]d by  12/13/2020, by Ultrasound presenting for routine prenatal visit  Plan   pregnancy2  Problems (from 02/24/20 to present)     Problem Noted Resolved   Chlamydia infection  during pregnancy 07/02/2020 by [name], CNM No   Overview Signed 07/02/2020  8:41 AM by [name], CNM    Treated at [facility], but vomited the medication . [facility] 4/19. Needs TOC in late May.      Supervision of normal pregnancy 05/01/2020 by Tresea Mall, CNM No   Overview Addendum 09/26/2020  8:44 AM by [name], CNM     Nursing Staff Provider  Office Location  [facility] Dating   8w [place]  Language  English Anatomy US  Completed- echogenic cardiac focus  Flu Vaccine   UTD Genetic Screen  declines  TDaP vaccine    Hgb A1C or  GTT Early: 96 Third trimester : 96  Rhogam  Not needed   LAB RESULTS   Feeding Plan Breast Blood Type   O+  Contraception Condoms Antibody  negative  Circumcision  Rubella  immune  Pediatrician   RPR   NR  Support Person  Francisco HBsAg   negative  Prenatal Classes  HIV  NR    Varicella  Immune  BTL Consent  n/a GBS  (For PCN allergy, check sensitivities)        VBAC Consent  n/a Pap  05/01/20    Hgb Electro      CF      SMA        Chlamydia + in pregnancy- TOC negative            Gestational age appropriate obstetric precautions including but not limited to vaginal bleeding, contractions, leaking of fluid and fetal movement were reviewed in detail with the patient.    Return in about 1 week (around 12/05/2020) for ROB in person- ofay to double book .  Natale Milch MD Lauralee Evener  OB/GYN, [facility] 11/28/2020, 9:37 AM

## 2020-11-28 NOTE — Patient Instructions (Signed)

## 2020-12-06 ENCOUNTER — Other Ambulatory Visit: Payer: Self-pay

## 2020-12-06 ENCOUNTER — Ambulatory Visit (INDEPENDENT_AMBULATORY_CARE_PROVIDER_SITE_OTHER): Payer: 59 | Admitting: Obstetrics & Gynecology

## 2020-12-06 ENCOUNTER — Encounter: Payer: Self-pay | Admitting: Obstetrics & Gynecology

## 2020-12-06 ENCOUNTER — Inpatient Hospital Stay
Admission: EM | Admit: 2020-12-06 | Discharge: 2020-12-08 | DRG: 806 | Disposition: A | Discharge status: Home / Self Care | Payer: 59 | Attending: Obstetrics & Gynecology | Admitting: Obstetrics & Gynecology

## 2020-12-06 VITALS — BP 120/70 | Wt 199.0 lb

## 2020-12-06 DIAGNOSIS — O9081 Anemia of the puerperium: Secondary | ICD-10-CM | POA: Diagnosis not present

## 2020-12-06 DIAGNOSIS — Z3490 Encounter for supervision of normal pregnancy, unspecified, unspecified trimester: Secondary | ICD-10-CM

## 2020-12-06 DIAGNOSIS — O26893 Other specified pregnancy related conditions, third trimester: Secondary | ICD-10-CM | POA: Diagnosis present

## 2020-12-06 DIAGNOSIS — Z3A39 39 weeks gestation of pregnancy: Secondary | ICD-10-CM

## 2020-12-06 DIAGNOSIS — D62 Acute posthemorrhagic anemia: Secondary | ICD-10-CM | POA: Diagnosis not present

## 2020-12-06 DIAGNOSIS — Z20822 Contact with and (suspected) exposure to covid-19: Secondary | ICD-10-CM | POA: Diagnosis not present

## 2020-12-06 DIAGNOSIS — Z3483 Encounter for supervision of other normal pregnancy, third trimester: Secondary | ICD-10-CM

## 2020-12-06 DIAGNOSIS — R198 Other specified symptoms and signs involving the digestive system and abdomen: Secondary | ICD-10-CM | POA: Diagnosis present

## 2020-12-06 DIAGNOSIS — A749 Chlamydial infection, unspecified: Secondary | ICD-10-CM

## 2020-12-06 LAB — POCT URINALYSIS DIPSTICK OB
Glucose, UA: NEGATIVE
POC,PROTEIN,UA: NEGATIVE

## 2020-12-06 NOTE — Progress Notes (Signed)
History and Physical  Khalis Haft is a 25 y.o. G2P1001 [redacted]w[redacted]d  for Induction of Labor scheduled due to Favorable cervix at term .   See labor record for pregnancy highlights.  No recent pain, bleeding, ruptured membranes, or other signs of progressing labor.  PMHx: She  has no past medical history on file. Also,  has a past surgical history that includes Appendectomy., family history includes Diabetes in her maternal grandmother and paternal grandmother; Hypertension in her father and mother; Stroke in her paternal grandmother.,  reports that she has never smoked. She has never used smokeless tobacco. She reports current alcohol use. She reports that she does not use drugs. She has a current medication list which includes the following prescription(s): metoclopramide and prenatal. Also, has No Known Allergies. OB History  Gravida Para Term Preterm AB Living  2 1 1     1   SAB IAB Ectopic Multiple Live Births               # Outcome Date GA Lbr Len/2nd Weight Sex Delivery Anes PTL Lv  2 Current           1 Term 01/29/15          Patient denies any other pertinent gynecologic issues.   Review of Systems  Constitutional:  Negative for chills, fever and malaise/fatigue.  HENT:  Negative for congestion, sinus pain and sore throat.   Eyes:  Negative for blurred vision and pain.  Respiratory:  Negative for cough and wheezing.   Cardiovascular:  Negative for chest pain and leg swelling.  Gastrointestinal:  Negative for abdominal pain, constipation, diarrhea, heartburn, nausea and vomiting.  Genitourinary:  Negative for dysuria, frequency, hematuria and urgency.  Musculoskeletal:  Negative for back pain, joint pain, myalgias and neck pain.  Skin:  Negative for itching and rash.  Neurological:  Negative for dizziness, tremors and weakness.  Endo/Heme/Allergies:  Does not bruise/bleed easily.  Psychiatric/Behavioral:  Negative for depression. The patient is not nervous/anxious and does not have  insomnia.    Objective: BP 120/70   Wt 199 lb (90.3 kg)   LMP 02/24/2020 (Exact Date)   BMI 34.16 kg/m  Physical Exam Constitutional:      General: She is not in acute distress.    Appearance: She is well-developed.  Genitourinary:     Right Labia: No rash or tenderness.    Left Labia: No tenderness or rash.    No vaginal erythema or bleeding.      Right Adnexa: not tender and no mass present.    Left Adnexa: not tender and no mass present.    No cervical motion tenderness, discharge, polyp or nabothian cyst.     Cervical exam comments: 2/70/-3, mid, soft VTX.     Uterus is not enlarged.     No uterine mass detected.    Pelvic exam was performed with patient in the lithotomy position.  HENT:     Head: Normocephalic and atraumatic.     Nose: Nose normal.  Abdominal:     General: There is no distension.     Palpations: Abdomen is soft.     Tenderness: There is no abdominal tenderness.     Comments: FHT 120s  Musculoskeletal:        General: Normal range of motion.  Neurological:     Mental Status: She is alert and oriented to person, place, and time.     Cranial Nerves: No cranial nerve deficit.  Skin:  General: Skin is warm and dry.  Psychiatric:        Attention and Perception: Attention normal.        Mood and Affect: Mood and affect normal.        Speech: Speech normal.        Behavior: Behavior normal.        Thought Content: Thought content normal.        Judgment: Judgment normal.    Assessment: Term Pregnancy for Induction of Labor due to Favorable cervix at term.  Plan: Patient will undergo induction of labor with pitocin, amniotomy.     Patient has been fully informed of the pros and cons, risks and benefits of continued observation with fetal monitoring versus that of induction of labor.   She understands that there are uncommon risks to induction, which include but are not limited to : frequent or prolonged uterine contractions, fetal distress,  uterine rupture, and lack of successful induction.  These risks include all methods including Pitocin and Misoprostol and Cervadil.  Patient understands that using Misoprostol for labor induction is an "off label" indication although it has been studied extensively for this purpose and is an accepted method of induction.  She also has been informed of the increased risks for Cesarean with induction and should induction not be successful.  Patient consents to the induction plan of management.  Plans to breast feed Plans condoms for contraception TDaP UTD GBS NEG  Annamarie Major, MD, [facility], [facility] 12/06/2020  9:16 AM

## 2020-12-06 NOTE — OB Triage Note (Signed)
Pt arrives G2 P1 with c/o ctx's every 5 minutes for 2100. Pt reports having membranes stripped at the office today. Pt denies fluid leaking or vaginal bleeding at this time.

## 2020-12-06 NOTE — Progress Notes (Signed)
  [facility] INDUCTION ASSESSMENT SCHEDULING Bethany Gonzalez 1995-12-04 Medical record #: [id number] Phone #:  Home Phone [phone]  Mobile [phone]    Prenatal Provider:[facility] Delivering Group:[facility] Proposed admission date/time:12/12/20 Method of induction:Pitocin  Weight: Filed Weights09/23/22 0834Weight:199 lb (90.3 kg) BMI Body mass index is 34.16 kg/m. HIV Negative HSV Negative EDC Estimated Date of Delivery: 9/30/22based on:LMP  Gestational age on admission: 43 Gravidity/parity:G2P1001  Cervix Score   0 1 2 3   Position Posterior Midposition Anterior   Consistency Firm Medium Soft   Effacement (%) 0-30 40-50 60-70 >80  Dilation (cm) Closed 1-2 3-4 >5  Baby's station -3 -2 -1 +1, +2   Bishop Score:7   Medical induction of labor  select indication(s) below Elective induction ?39 weeks multiparous patient ?39 weeks primiparous patient with Bishop score ?7 ?40 weeks primiparous patient   Medical Indications Adapted from ACOG Committee Opinion #560, "Medically Indicated Late Preterm and Early Term Deliveries," 2013.  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 (41 weeks) Mo-Di isolated (32.0-34.6) Pregestational, vascular compromise (37.0- 39.0)  PPROM (34.0) Multiple  Gestation Gestational, diet controlled (40.0)  Hx of IUFD (39.0 weeks) Di-Di (38.0-38.6) Gestational, med controlled (39.0)  Polyhydramnios, mild/moderate; SDV 8-16 or AFI 25-35 (39.0) Mo-Di (36.0-37.6) Gestational, uncontrolled (38.0-39.0)  Oligohydramnios (36.0-37.6); MVP <2 cm    Provider Signature: 06-02-1988 Scheduled Bethany Gonzalez Date:12/06/2020 9:09 AM   Call [phone] to finalize the induction date/time  [phone] (07/17)

## 2020-12-06 NOTE — Patient Instructions (Signed)
Labor Induction 12/12/20 0800  PRE ADMISSION TESTING For Covid, prior to procedure Wednesday 9/28 9:00-10:00 [facility] entrance (drive up)  Results in 79-02 hours You will not receive notification if test results are negative. If positive for Covid19, your provider will notify you by phone, with additional instructions.  Labor induction is when steps are taken to cause a pregnant woman to begin the labor process. Most women go into labor on their own between 37 weeks and 42 weeks of pregnancy. When this does not happen, or when there is a medical need for labor to begin, steps may be taken to induce, or bring on, labor. Labor induction causes a pregnant woman's uterus to contract. It also causes the cervix to soften (ripen), open (dilate), and thin out. Usually, labor is not induced before 39 weeks of pregnancy unless there is a medical reason to do so. When is labor induction considered? Labor induction may be right for you if: Your pregnancy lasts longer than 41 to 42 weeks. Your placenta is separating from your uterus (placental abruption). You have a rupture of membranes and your labor does not begin. You have health problems, like diabetes or high blood pressure (preeclampsia) during your pregnancy. Your baby has stopped growing or does not have enough amniotic fluid. Before labor induction begins, your health care provider will consider the following factors: Your medical condition and the baby's condition. How many weeks you have been pregnant. How mature the baby's lungs are. The condition of your cervix. The position of the baby. The size of your birth canal. Tell a health care provider about: Any allergies you have. All medicines you are taking, including vitamins, herbs, eye drops, creams, and over-the-counter medicines. Any problems you or your family members have had with anesthetic medicines. Any surgeries you have had. Any blood disorders you have. Any  medical conditions you have. What are the risks? Generally, this is a safe procedure. However, problems may occur, including: Failed induction. Changes in fetal heart rate, such as being too high, too low, or irregular (erratic). Infection in the mother or the baby. Increased risk of having a cesarean delivery. Breaking off (abruption) of the placenta from the uterus. This is rare. Rupture of the uterus. This is very rare. Your baby could fail to get enough blood flow or oxygen. This can be life-threatening. When induction is needed for medical reasons, the benefits generally outweigh the risks. What happens during the procedure? During the procedure, your health care provider will use one of these methods to induce labor: Stripping the membranes. In this method, the amniotic sac tissue is gently separated from the cervix. This causes the following to happen: Your cervix stretches, which in turn causes the release of prostaglandins. Prostaglandins induce labor and cause the uterus to contract. This procedure is often done in an office visit. You will be sent home to wait for contractions to begin. Prostaglandin medicine. This medicine starts contractions and causes the cervix to dilate and ripen. This can be taken by mouth (orally) or by being inserted into the vagina (suppository). Inserting a small, thin tube (catheter) with a balloon into the vagina and then expanding the balloon with water to dilate the cervix. Breaking the water. In this method, a small instrument is used to make a small hole in the amniotic sac. This eventually causes the amniotic sac to break. Contractions should begin within a few hours. Medicine to trigger or strengthen contractions. This medicine is given through an IV that  is inserted into a vein in your arm. This procedure may vary among health care providers and hospitals. Where to find more information March of Dimes: www.marchofdimes.org [employer] of  Obstetricians and Gynecologists: www.acog.org Summary Labor induction causes a pregnant woman's uterus to contract. It also causes the cervix to soften (ripen), open (dilate), and thin out. Labor is usually not induced before 39 weeks of pregnancy unless there is a medical reason to do so. When induction is needed for medical reasons, the benefits generally outweigh the risks. Talk with your health care provider about which methods of labor induction are right for you. This information is not intended to replace advice given to you by your health care provider. Make sure you discuss any questions you have with your health care provider. Document Revised: 12/14/2019 Document Reviewed: 12/14/2019 Elsevier Patient Education  2022 [employer].

## 2020-12-07 ENCOUNTER — Encounter: Payer: Self-pay | Admitting: Obstetrics & Gynecology

## 2020-12-07 DIAGNOSIS — Z20822 Contact with and (suspected) exposure to covid-19: Secondary | ICD-10-CM | POA: Diagnosis present

## 2020-12-07 DIAGNOSIS — Z3A39 39 weeks gestation of pregnancy: Secondary | ICD-10-CM | POA: Diagnosis not present

## 2020-12-07 DIAGNOSIS — D62 Acute posthemorrhagic anemia: Secondary | ICD-10-CM | POA: Diagnosis not present

## 2020-12-07 DIAGNOSIS — O26893 Other specified pregnancy related conditions, third trimester: Secondary | ICD-10-CM | POA: Diagnosis present

## 2020-12-07 DIAGNOSIS — O9081 Anemia of the puerperium: Secondary | ICD-10-CM | POA: Diagnosis not present

## 2020-12-07 LAB — CBC
HCT: 31.2 % — ABNORMAL LOW (ref 36.0–46.0)
Hemoglobin: 10.9 g/dL — ABNORMAL LOW (ref 12.0–15.0)
MCH: 29.8 pg (ref 26.0–34.0)
MCHC: 34.9 g/dL (ref 30.0–36.0)
MCV: 85.2 fL (ref 80.0–100.0)
Platelets: 199 10*3/uL (ref 150–400)
RBC: 3.66 MIL/uL — ABNORMAL LOW (ref 3.87–5.11)
RDW: 13.7 % (ref 11.5–15.5)
WBC: 14.8 10*3/uL — ABNORMAL HIGH (ref 4.0–10.5)
nRBC: 0 % (ref 0.0–0.2)

## 2020-12-07 LAB — RESP PANEL BY RT-PCR (FLU A&B, COVID) ARPGX2
Influenza A by PCR: NEGATIVE
Influenza B by PCR: NEGATIVE
SARS Coronavirus 2 by RT PCR: NEGATIVE

## 2020-12-07 LAB — TYPE AND SCREEN
ABO/RH(D): O POS
Antibody Screen: NEGATIVE

## 2020-12-07 LAB — RPR: RPR Ser Ql: NONREACTIVE

## 2020-12-07 MED ORDER — OXYCODONE-ACETAMINOPHEN 5-325 MG PO TABS: 1.0000 | ORAL_TABLET | ORAL | Status: DC | PRN

## 2020-12-07 MED ORDER — DIPHENHYDRAMINE HCL 25 MG PO CAPS: 25.0000 mg | ORAL_CAPSULE | Freq: Four times a day (QID) | ORAL | Status: DC | PRN

## 2020-12-07 MED ORDER — SENNOSIDES-DOCUSATE SODIUM 8.6-50 MG PO TABS
2.0000 | ORAL_TABLET | ORAL | Status: DC
  Administered 2020-12-07 – 2020-12-08 (×2): 2 via ORAL
  Filled 2020-12-07 (×2): qty 2

## 2020-12-07 MED ORDER — LACTATED RINGERS IV SOLN: INTRAVENOUS | Status: DC

## 2020-12-07 MED ORDER — LIDOCAINE HCL (PF) 1 % IJ SOLN
INTRAMUSCULAR | Status: AC
  Administered 2020-12-07: 30 mL via SUBCUTANEOUS
  Filled 2020-12-07: qty 30

## 2020-12-07 MED ORDER — LIDOCAINE HCL (PF) 1 % IJ SOLN: 30.0000 mL | INTRAMUSCULAR | Status: AC | PRN

## 2020-12-07 MED ORDER — SIMETHICONE 80 MG PO CHEW: 80.0000 mg | CHEWABLE_TABLET | ORAL | Status: DC | PRN

## 2020-12-07 MED ORDER — SODIUM CHLORIDE 0.9% FLUSH: 3.0000 mL | INTRAVENOUS | Status: DC | PRN

## 2020-12-07 MED ORDER — ONDANSETRON HCL 4 MG PO TABS: 4.0000 mg | ORAL_TABLET | ORAL | Status: DC | PRN

## 2020-12-07 MED ORDER — ONDANSETRON HCL 4 MG/2ML IJ SOLN: 4.0000 mg | Freq: Four times a day (QID) | INTRAMUSCULAR | Status: DC | PRN

## 2020-12-07 MED ORDER — DIBUCAINE (PERIANAL) 1 % EX OINT: 1.0000 "application " | TOPICAL_OINTMENT | CUTANEOUS | Status: DC | PRN

## 2020-12-07 MED ORDER — ACETAMINOPHEN 325 MG PO TABS: 650.0000 mg | ORAL_TABLET | ORAL | Status: DC | PRN

## 2020-12-07 MED ORDER — ZOLPIDEM TARTRATE 5 MG PO TABS: 5.0000 mg | ORAL_TABLET | Freq: Every evening | ORAL | Status: DC | PRN

## 2020-12-07 MED ORDER — COCONUT OIL OIL: 1.0000 "application " | TOPICAL_OIL | Status: DC | PRN

## 2020-12-07 MED ORDER — AMMONIA AROMATIC IN INHA
RESPIRATORY_TRACT | Status: AC
  Filled 2020-12-07: qty 10

## 2020-12-07 MED ORDER — INFLUENZA VAC SPLIT QUAD 0.5 ML IM SUSY
0.5000 mL | PREFILLED_SYRINGE | INTRAMUSCULAR | Status: DC
  Filled 2020-12-07 (×2): qty 0.5

## 2020-12-07 MED ORDER — SODIUM CHLORIDE 0.9% FLUSH
3.0000 mL | Freq: Two times a day (BID) | INTRAVENOUS | Status: DC
  Administered 2020-12-07: 3 mL via INTRAVENOUS

## 2020-12-07 MED ORDER — IBUPROFEN 600 MG PO TABS
600.0000 mg | ORAL_TABLET | Freq: Four times a day (QID) | ORAL | Status: DC
  Filled 2020-12-07 (×3): qty 1

## 2020-12-07 MED ORDER — OXYTOCIN-SODIUM CHLORIDE 30-0.9 UT/500ML-% IV SOLN: 2.5000 [IU]/h | INTRAVENOUS | Status: DC

## 2020-12-07 MED ORDER — MISOPROSTOL 200 MCG PO TABS
ORAL_TABLET | ORAL | Status: AC
  Filled 2020-12-07: qty 4

## 2020-12-07 MED ORDER — BENZOCAINE-MENTHOL 20-0.5 % EX AERO: 1.0000 "application " | INHALATION_SPRAY | CUTANEOUS | Status: DC | PRN

## 2020-12-07 MED ORDER — OXYCODONE-ACETAMINOPHEN 5-325 MG PO TABS: 2.0000 | ORAL_TABLET | ORAL | Status: DC | PRN

## 2020-12-07 MED ORDER — WITCH HAZEL-GLYCERIN EX PADS: 1.0000 "application " | MEDICATED_PAD | CUTANEOUS | Status: DC | PRN

## 2020-12-07 MED ORDER — OXYTOCIN 10 UNIT/ML IJ SOLN
INTRAMUSCULAR | Status: AC
  Filled 2020-12-07: qty 2

## 2020-12-07 MED ORDER — ONDANSETRON HCL 4 MG/2ML IJ SOLN: 4.0000 mg | INTRAMUSCULAR | Status: DC | PRN

## 2020-12-07 MED ORDER — LACTATED RINGERS IV SOLN: 500.0000 mL | INTRAVENOUS | Status: DC | PRN

## 2020-12-07 MED ORDER — SODIUM CHLORIDE 0.9 % IV SOLN: 250.0000 mL | INTRAVENOUS | Status: DC | PRN

## 2020-12-07 MED ORDER — BUTORPHANOL TARTRATE 1 MG/ML IJ SOLN
1.0000 mg | INTRAMUSCULAR | Status: DC | PRN
  Administered 2020-12-07: 1 mg via INTRAVENOUS
  Filled 2020-12-07: qty 1

## 2020-12-07 MED ORDER — OXYTOCIN BOLUS FROM INFUSION
333.0000 mL | Freq: Once | INTRAVENOUS | Status: AC
  Administered 2020-12-07: 333 mL via INTRAVENOUS

## 2020-12-07 NOTE — Progress Notes (Signed)
Obstetric Postpartum Daily Progress Note  Chief Complaint: Postpartum care after vaginal delivery  Subjective:  25 y.o. [id number] postpartum day #0 status post vaginal delivery.  She is ambulating, is tolerating po, is voiding spontaneously.  Her pain is well controlled on PO pain medications. Her lochia is less than menses.   Medications SCHEDULED MEDICATIONS   ibuprofen  600 mg Oral Q6H   [START ON 12/08/2020] influenza vac split quadrivalent PF  0.5 mL Intramuscular Tomorrow-1000   senna-docusate  2 tablet Oral Q24H   sodium chloride flush  3 mL Intravenous Q12H    MEDICATION INFUSIONS   sodium chloride      PRN MEDICATIONS  sodium chloride flush **AND** sodium chloride flush **AND** sodium chloride, acetaminophen, benzocaine-Menthol, coconut oil, witch hazel-glycerin **AND** dibucaine, diphenhydrAMINE, ondansetron **OR** ondansetron (ZOFRAN) IV, oxyCODONE-acetaminophen, oxyCODONE-acetaminophen, simethicone, zolpidem    Objective:   Vitals:   12/07/20 0429 12/07/20 0545 12/07/20 0723 12/07/20 1146  BP: 131/85 120/78 126/82 126/77  Pulse: 83 77 82 83  Resp: 18 18 19 19   Temp: 98 F (36.7 C) 97.9 F (36.6 C) 98.6 F (37 C) 98.8 F (37.1 C)  TempSrc: Oral Oral Oral Oral  SpO2: 98% 99% 99%   Weight:      Height:        Current Vital Signs 24h Vital Sign Ranges  T 98.8 F (37.1 C) Temp  Avg: 98.3 F (36.8 C)  Min: 97.9 F (36.6 C)  Max: 98.8 F (37.1 C)  BP 126/77 BP  Min: 110/64  Max: 131/85  HR 83 Pulse  Avg: 80.5  Min: 73  Max: 85  RR 19 Resp  Avg: 18.4  Min: 18  Max: 19  SaO2 99 % Room Air SpO2  Avg: 98.7 %  Min: 98 %  Max: 99 %       24 Hour I/O Current Shift I/O  Time Ins Outs 09/23 0701 - 09/24 0700 In: 360 [P.O.:360] Out: 1010 [Urine:400] No intake/output data recorded.  General: NAD Pulmonary: no increased work of breathing   Labs:  Recent Labs  Lab 12/07/20 0012  WBC 14.8*  HGB 10.9*  HCT 31.2*  PLT 199     Assessment:   25 y.o. G2P2002  postpartum day # 0 status post SVD, doing well  Plan:   1) Acute blood loss anemia - hemodynamically stable and asymptomatic - po ferrous sulfate  2) O POS / / Rubella 1.82 (02/22 1133)/ Varicella Immune  3) TDAP status 10/08/2020  4) breast feeding /Contraception = did not ask  5) Disposition: home PPD#1  10/10/2020, MD 12/07/2020 12:32 PM

## 2020-12-07 NOTE — Discharge Summary (Signed)
Postpartum Discharge Summary    Patient Name: Bethany Gonzalez DOB: 1995/10/13 MRN: [id number]  Date of admission: 12/06/2020 Delivery date:12/07/2020  Delivering provider: Gae Dry  Date of discharge: 12/08/2020  Admitting diagnosis: Normal labor and delivery Intrauterine pregnancy: [redacted]w[redacted]d     Secondary diagnosis:  Principal Problem:   Supervision of normal pregnancy Active Problems:   Abdominal complaints   Normal labor and delivery   [redacted] weeks gestation of pregnancy  Additional problems: none    Discharge diagnosis: Term Pregnancy Delivered                                              Post partum procedures: none Augmentation: AROM Complications: None  Hospital course: Onset of Labor With Vaginal Delivery      25 y.o. yo G2P1001 at [redacted]w[redacted]d was admitted in Active Labor on 12/06/2020. Patient had an uncomplicated labor course as follows:  Membrane Rupture Time/Date: 12:29 AM ,12/07/2020   Delivery Method:Vaginal, Spontaneous  Episiotomy: None  Lacerations:  1st degree  Patient had an uncomplicated postpartum course.  She is ambulating, tolerating a regular diet, passing flatus, and urinating well. Patient is discharged home in stable condition on 12/08/20.  Newborn Data: Birth date:12/07/2020  Birth time:2:08 AM  Gender:Female  Living status:Living  Apgars:8 ,9  Weight:3360 g   Magnesium Sulfate received: No BMZ received: No Rhophylac:No MMR:No T-DaP:Given prenatally Flu: Yes on 12/20/2019 Transfusion:No  Physical exam  Vitals:   12/07/20 0723 12/07/20 1146 12/07/20 1535 12/07/20 2300  BP: 126/82 126/77 111/68 115/69  Pulse: 82 83 75 71  Resp: $Remo'19 19 20 18  'zGRUt$ Temp: 98.6 F (37 C) 98.8 F (37.1 C) 98.4 F (36.9 C) 98.3 F (36.8 C)  TempSrc: Oral Oral Oral Oral  SpO2: 99%   98%  Weight:      Height:       General: alert, cooperative, and no distress Lochia: appropriate Uterine Fundus: firm Incision: N/A DVT Evaluation: No evidence of DVT seen on physical  exam. No cords or calf tenderness. No significant calf/ankle edema. Labs: Lab Results  Component Value Date   WBC 8.5 12/08/2020   HGB 9.2 (L) 12/08/2020   HCT 26.9 (L) 12/08/2020   MCV 88.5 12/08/2020   PLT 156 12/08/2020   CMP Latest Ref Rng & Units 07/25/2013  Glucose 65 - 99 mg/dL 84  BUN 7 - 18 mg/dL 6(L)  Creatinine 0.60 - 1.30 mg/dL 0.45(L)  Sodium 136 - 145 mmol/L 136  Potassium 3.5 - 5.1 mmol/L 3.6  Chloride 98 - 107 mmol/L 104  CO2 21 - 32 mmol/L 23  Calcium 9.0 - 10.7 mg/dL 8.6(L)  Total Protein 6.4 - 8.6 g/dL 7.4  Total Bilirubin 0.2 - 1.0 mg/dL 0.3  Alkaline Phos Unit/L 53  AST 0 - 26 Unit/L 11  ALT 12 - 78 U/L 17   Edinburgh Score: Edinburgh Postnatal Depression Scale Screening Tool 12/07/2020  I have been able to laugh and see the funny side of things. 0  I have looked forward with enjoyment to things. 0  I have blamed myself unnecessarily when things went wrong. 0  I have been anxious or worried for no good reason. 0  I have felt scared or panicky for no good reason. 0  Things have been getting on top of me. 0  I have been so unhappy that I have had difficulty sleeping.  0  I have felt sad or miserable. 1  I have been so unhappy that I have been crying. 0  The thought of harming myself has occurred to me. 0  Edinburgh Postnatal Depression Scale Total 1      After visit meds:  Allergies as of 12/08/2020   No Known Allergies      Medication List     STOP taking these medications    metoCLOPramide 10 MG tablet Commonly known as: REGLAN       TAKE these medications    acetaminophen 500 MG tablet Commonly known as: TYLENOL Take 2 tablets (1,000 mg total) by mouth every 8 (eight) hours as needed for mild pain.   ibuprofen 600 MG tablet Commonly known as: ADVIL Take 1 tablet (600 mg total) by mouth every 6 (six) hours as needed for mild pain or cramping.   Prenatal 28-0.8 MG Tabs Take 1 tablet by mouth daily.                Discharge Care Instructions  (From admission, onward)           Start     Ordered   12/08/20 0000  Discharge wound care:       Comments: Perform wound care instructions   12/08/20 0756             Discharge home in stable condition Infant Feeding: Bottle and Breast Infant Disposition:home with mother Discharge instruction: per After Visit Summary and Postpartum booklet. Activity: Advance as tolerated. Pelvic rest for 6 weeks.  Diet: routine diet Anticipated Birth Control: Unsure Postpartum Appointment:6 weeks Additional Postpartum F/U:  none Future Appointments:No future appointments. Follow up Visit:  Follow-up Information     Gae Dry, MD. Schedule an appointment as soon as possible for a visit in 6 week(s).   Specialty: Obstetrics and Gynecology Why: For Post Partum Check Contact information: [address] [place] [address] [phone]                 SIGNED:  Prentice Docker, MD, [facility], [facility] 12/08/2020 7:58 AM

## 2020-12-07 NOTE — H&P (Signed)
Obstetrics Admission History & Physical   No chief complaint on file.   HPI:  25 y.o. G2P1001 @ [redacted]w[redacted]d (12/13/2020, by Ultrasound). Admitted on 12/06/2020:   Patient Active Problem List   Diagnosis Date Noted   Normal labor and delivery 12/07/2020   Abdominal complaints 12/06/2020   Chlamydia infection during pregnancy 07/02/2020   Supervision of normal pregnancy 05/01/2020     Presents for painful ctx spains, no VB or ROM.   Prenatal care at: at [facility]. Pregnancy complicated by none.  ROS: A review of systems was performed and negative, except as stated in the above HPI. PMHx: History reviewed. No pertinent past medical history. PSHx:  Past Surgical History:  Procedure Laterality Date   APPENDECTOMY     Medications:  Medications Prior to Admission  Medication Sig Dispense Refill Last Dose   metoCLOPramide (REGLAN) 10 MG tablet Take 1 tablet (10 mg total) by mouth 4 (four) times daily -  before meals and at bedtime. 120 tablet 2 12/06/2020   Prenatal 28-0.8 MG TABS Take 1 tablet by mouth daily.   12/06/2020   Allergies: has No Known Allergies. OBHx:  OB History  Gravida Para Term Preterm AB Living  2 1 1     1   SAB IAB Ectopic Multiple Live Births               # Outcome Date GA Lbr Len/2nd Weight Sex Delivery Anes PTL Lv  2 Current           1 Term 01/29/15           01/31/15 except as detailed in HPI.XLK:GMWNUUVO/[id number]  No family history of birth defects. Soc Hx: Alcohol: none and Recreational drug use: none  Objective:   Vitals:   12/06/20 2235  BP: 128/90  Pulse: 84  Resp: 18  Temp: 98.4 F (36.9 C)   Constitutional: Well nourished, well developed female in no acute distress.  HEENT: normal Skin: Warm and dry.  Cardiovascular:Regular rate and rhythm.   Extremity: trace to 1+ bilateral pedal edema Respiratory: Clear to auscultation bilateral. Normal respiratory effort Abdomen: gravid, ND, FHT present, moderate tenderness on exam Back: no CVAT Neuro:  DTRs 2+, Cranial nerves grossly intact Psych: Alert and Oriented x3. No memory deficits. Normal mood and affect.  MS: normal gait, normal bilateral lower extremity ROM/strength/stability.  Pelvic exam: is not limited by body habitus EGBUS: within normal limits Vagina: within normal limits and with normal mucosa Cervix: CERVIX: 6 cm dilated, 70 effaced, -3 station Uterus: Spontaneous uterine activity  Adnexa: not evaluated  EFM:FHR: 140 bpm, variability: moderate,  accelerations:  Present,  decelerations:  Absent Toco: Frequency: Every 2-3 minutes   Perinatal info:  Blood type: O positive Rubella- Immune Varicella -Immune TDaP Given during third trimester of this pregnancy RPR NR / HIV Neg/ HBsAg Neg   Assessment & Plan:   25 y.o. G2P1001 @ [redacted]w[redacted]d, Admitted on 12/06/2020:Active Labor    Admit for labor, Observe for cervical change, Fetal Wellbeing Reassuring, and AROM when Appropriate  12/08/2020, MD, Annam[facility] Major [facility], [facility] 12/07/2020  12:22 AM

## 2020-12-08 LAB — CBC
HCT: 26.9 % — ABNORMAL LOW (ref 36.0–46.0)
Hemoglobin: 9.2 g/dL — ABNORMAL LOW (ref 12.0–15.0)
MCH: 30.3 pg (ref 26.0–34.0)
MCHC: 34.2 g/dL (ref 30.0–36.0)
MCV: 88.5 fL (ref 80.0–100.0)
Platelets: 156 10*3/uL (ref 150–400)
RBC: 3.04 MIL/uL — ABNORMAL LOW (ref 3.87–5.11)
RDW: 14.4 % (ref 11.5–15.5)
WBC: 8.5 10*3/uL (ref 4.0–10.5)
nRBC: 0 % (ref 0.0–0.2)

## 2020-12-08 MED ORDER — ACETAMINOPHEN 500 MG PO TABS: 1000.0000 mg | ORAL_TABLET | Freq: Three times a day (TID) | ORAL | Status: DC | PRN

## 2020-12-08 MED ORDER — IBUPROFEN 600 MG PO TABS: 600.0000 mg | ORAL_TABLET | Freq: Four times a day (QID) | ORAL | 0 refills | Status: DC | PRN

## 2020-12-08 NOTE — Progress Notes (Signed)
Reviewed D/C instructions with pt and family. Pt verbalized understanding of teaching. Discharged to home via W/C. Pt to schedule f/u appt.  

## 2021-01-24 ENCOUNTER — Other Ambulatory Visit: Payer: Self-pay

## 2021-01-24 ENCOUNTER — Encounter: Payer: Self-pay | Admitting: Obstetrics & Gynecology

## 2021-01-24 ENCOUNTER — Ambulatory Visit (INDEPENDENT_AMBULATORY_CARE_PROVIDER_SITE_OTHER): Payer: 59 | Admitting: Obstetrics & Gynecology

## 2021-01-24 NOTE — Progress Notes (Signed)
  OBSTETRICS POSTPARTUM CLINIC PROGRESS NOTE  Subjective:     Bethany Gonzalez is a 25 y.o. G17P2002 female who presents for a postpartum visit. She is 6 weeks postpartum following a Term pregnancy, Single fetus, or Uncomplicated pregnancy and delivery by Vaginal, no problems at delivery.  I have fully reviewed the prenatal and intrapartum course. Anesthesia: none.  Postpartum course has been complicated by uncomplicated.  Baby is feeding by Bottle.  Bleeding: patient has not  resumed menses.  Bowel function is normal. Bladder function is normal.  Patient is not sexually active. Contraception method desired is  unsure .  Postpartum depression screening: negative. Edinburgh 0.  The following portions of the patient's history were reviewed and updated as appropriate: allergies, current medications, past family history, past medical history, past social history, past surgical history, and problem list.  Review of Systems Pertinent items are noted in HPI.  Objective:    BP 120/80   Ht 5\' 6"  (1.676 m)   Wt 181 lb (82.1 kg)   LMP 02/24/2020 (Exact Date)   BMI 29.21 kg/m   General:  alert and no distress   Breasts:  inspection negative, no nipple discharge or bleeding, no masses or nodularity palpable  Lungs: clear to auscultation bilaterally  Heart:  regular rate and rhythm, S1, S2 normal, no murmur, click, rub or gallop  Abdomen: soft, non-tender; bowel sounds normal; no masses,  no organomegaly.     Vulva:  normal  Vagina: normal vagina, no discharge, exudate, lesion, or erythema  Cervix:  no cervical motion tenderness and no lesions  Corpus: normal size, contour, position, consistency, mobility, non-tender  Adnexa:  normal adnexa and no mass, fullness, tenderness  Rectal Exam: Not performed.          Assessment:  Post Partum Care visit 1. Postpartum care following vaginal delivery Discussed all types of contraception PAP due Feb 2023 Plan:  See orders and Patient  Instructions Resume all normal activities Follow up in: 3 months or as needed.   Mar 2023, MD, Annamarie Major [facility]/G[facility], [facility] 01/24/2021  11:59 AM

## 2021-01-24 NOTE — Patient Instructions (Signed)

## 2021-11-25 ENCOUNTER — Other Ambulatory Visit: Payer: Self-pay

## 2021-11-25 MED ORDER — DOXYCYCLINE HYCLATE 100 MG PO TABS
100.0000 mg | ORAL_TABLET | Freq: Two times a day (BID) | ORAL | 0 refills | Status: AC
  Filled 2021-11-25: qty 20, 10d supply, fill #0

## 2022-07-08 ENCOUNTER — Other Ambulatory Visit: Payer: Self-pay
